# Patient Record
Sex: Female | Born: 1980 | Race: White | Hispanic: Yes | Marital: Single | Smoking: Current every day smoker
Health system: Southern US, Community
[De-identification: ages and names within clinical notes are randomized; demographics above are authoritative.]

## PROBLEM LIST (undated history)

## (undated) DIAGNOSIS — N870 Mild cervical dysplasia: Secondary | ICD-10-CM

## (undated) DIAGNOSIS — R87612 Low grade squamous intraepithelial lesion on cytologic smear of cervix (LGSIL): Secondary | ICD-10-CM

## (undated) DIAGNOSIS — F32A Depression, unspecified: Secondary | ICD-10-CM

## (undated) DIAGNOSIS — F419 Anxiety disorder, unspecified: Secondary | ICD-10-CM

## (undated) HISTORY — DX: Mild cervical dysplasia: N87.0

## (undated) HISTORY — DX: Low grade squamous intraepithelial lesion on cytologic smear of cervix (LGSIL): R87.612

## (undated) HISTORY — DX: Depression, unspecified: F32.A

## (undated) HISTORY — PX: WISDOM TOOTH EXTRACTION: SHX21

## (undated) HISTORY — DX: Anxiety disorder, unspecified: F41.9

---

## 2008-11-23 ENCOUNTER — Inpatient Hospital Stay: Payer: Self-pay | Admitting: Obstetrics & Gynecology

## 2010-02-09 ENCOUNTER — Emergency Department: Payer: Self-pay | Admitting: Family Medicine

## 2010-02-11 ENCOUNTER — Emergency Department: Payer: Self-pay | Admitting: Emergency Medicine

## 2010-02-12 ENCOUNTER — Ambulatory Visit: Payer: Self-pay | Admitting: Obstetrics and Gynecology

## 2010-08-09 HISTORY — PX: DILATION AND CURETTAGE OF UTERUS: SHX78

## 2010-12-17 ENCOUNTER — Emergency Department: Payer: Self-pay | Admitting: Emergency Medicine

## 2011-04-06 IMAGING — US US OB < 14 WEEKS - US OB TV
1 series · 17 of 28 positions shown · non-contrast
Comparison: none

REASON FOR EXAM: pregnancy, vaginal bleeding
COMMENTS:

[Series 1: us ob < 14 weeks - us ob tv · 17 of 144 slices shown]
[im 1/144]
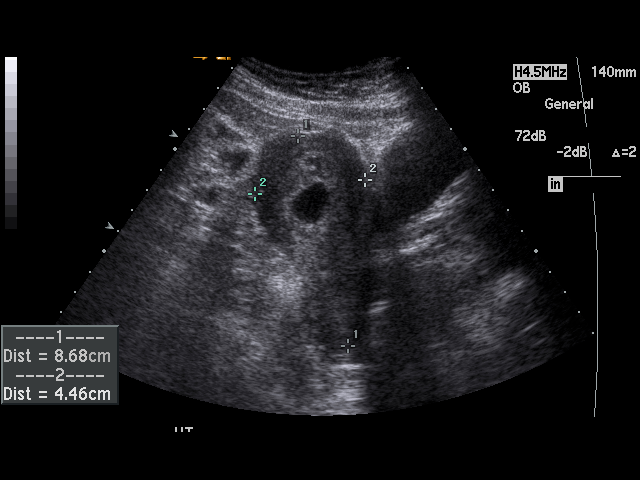
[im 11/144]
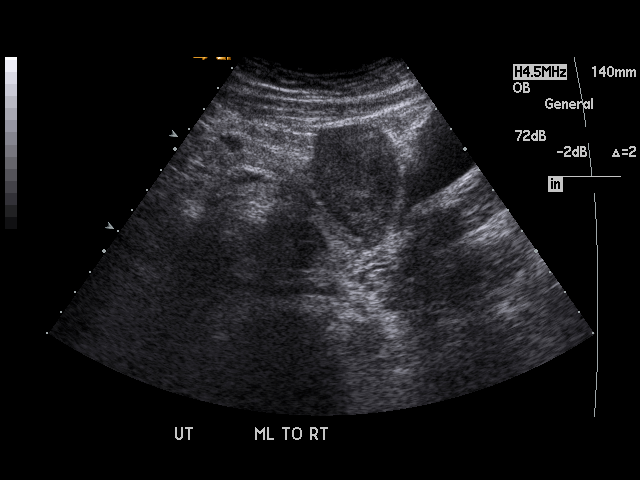
[im 22/144]
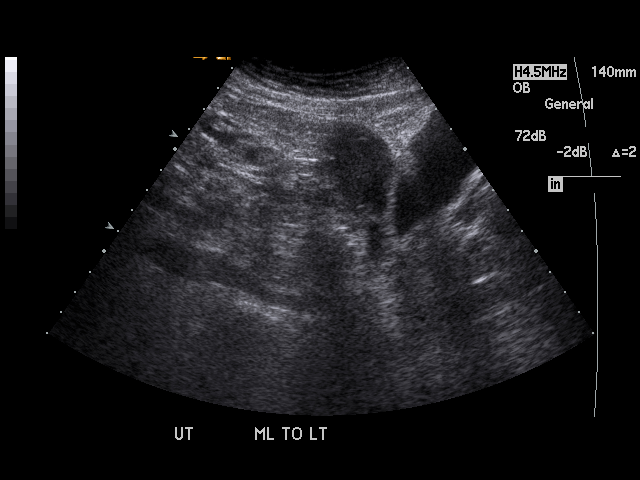
[im 27/144]
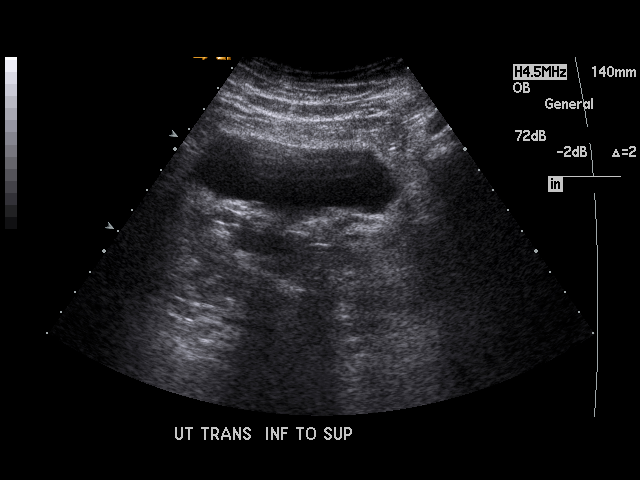
[im 38/144]
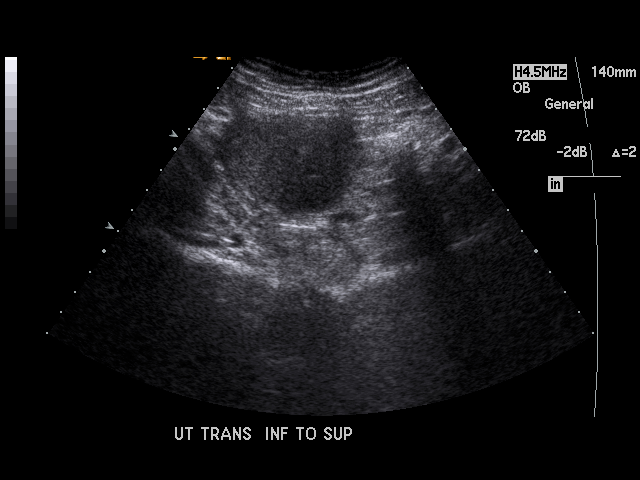
[im 48/144]
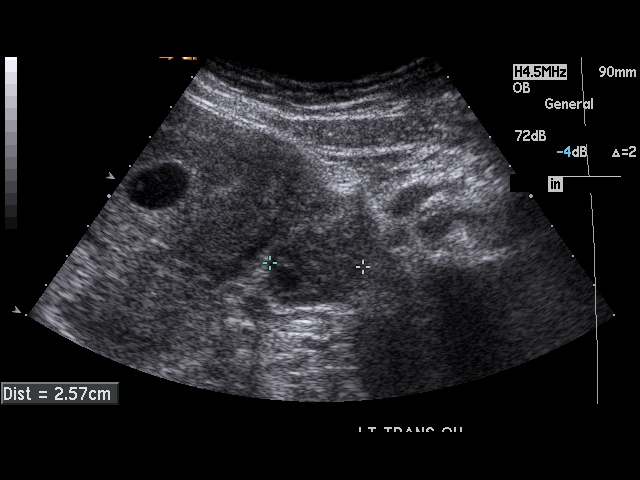
[im 53/144]
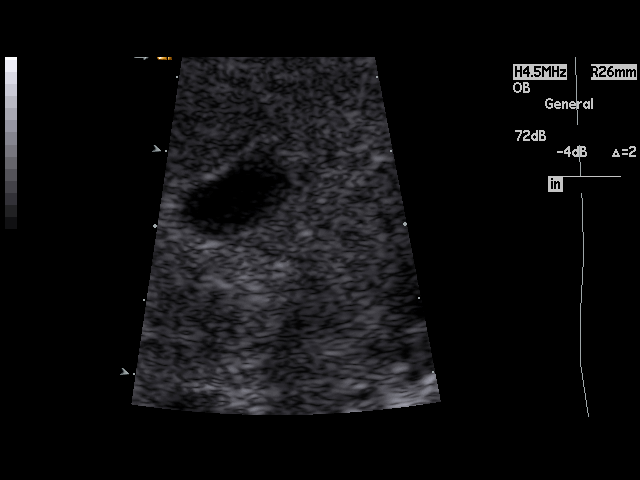
[im 64/144]
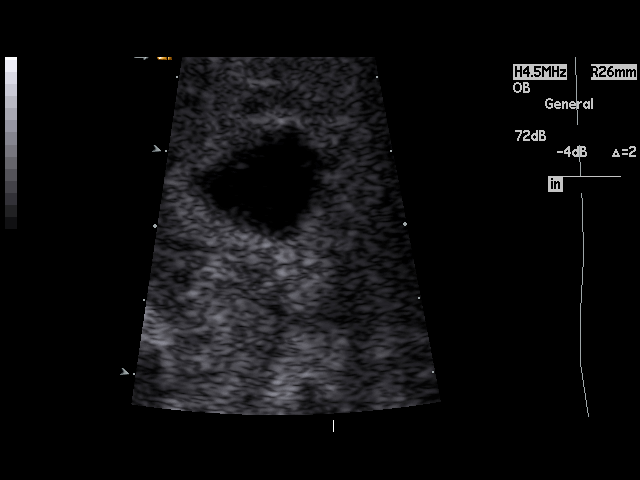
[im 75/144]
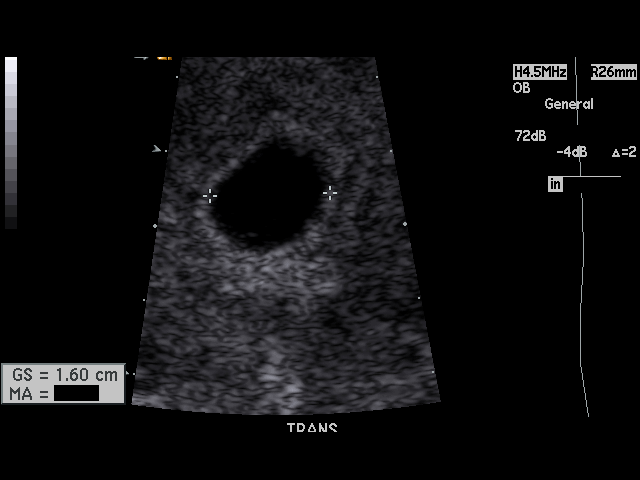
[im 80/144]
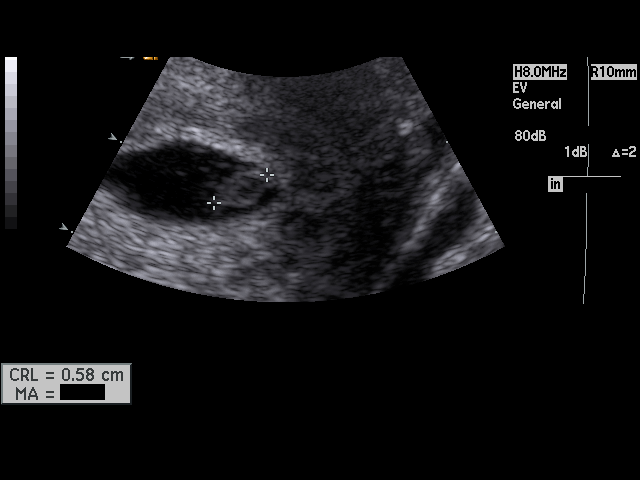
[im 91/144]
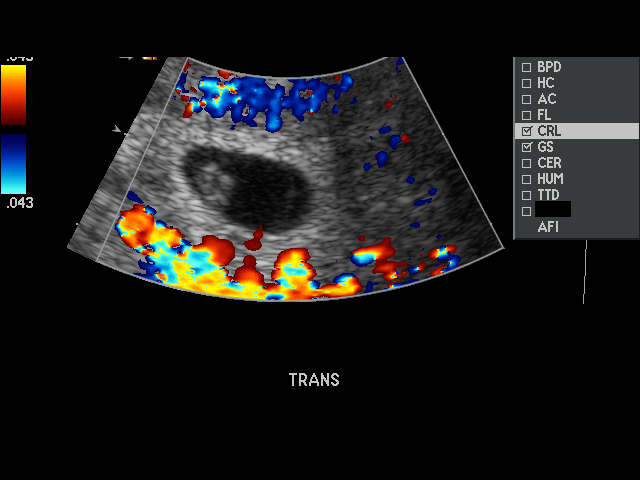
[im 96/144]
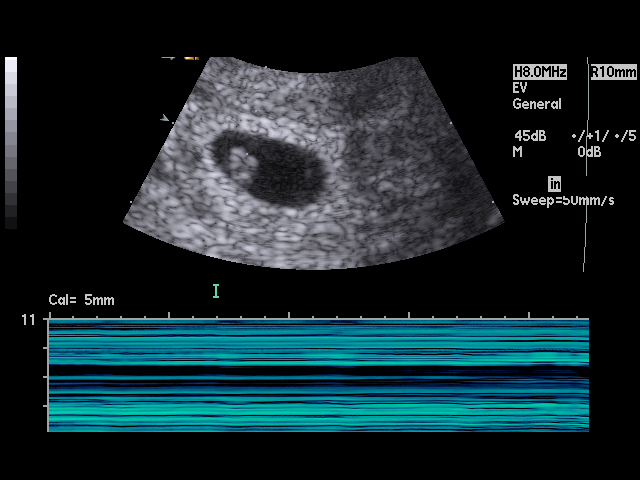
[im 106/144]
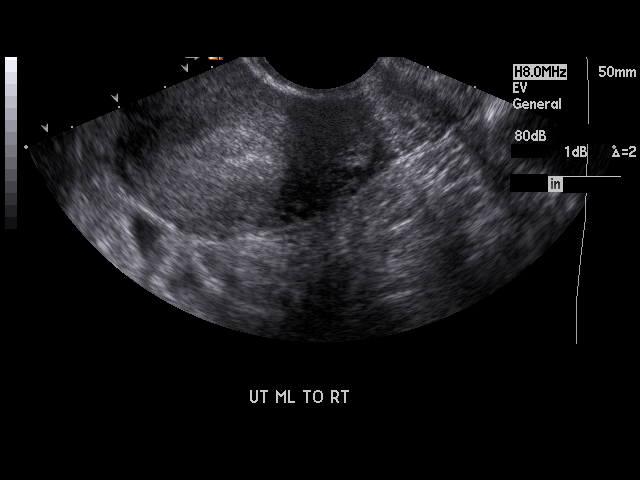
[im 117/144]
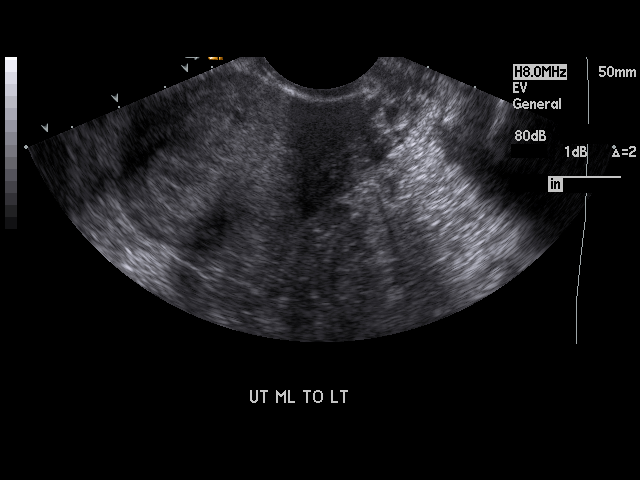
[im 122/144]
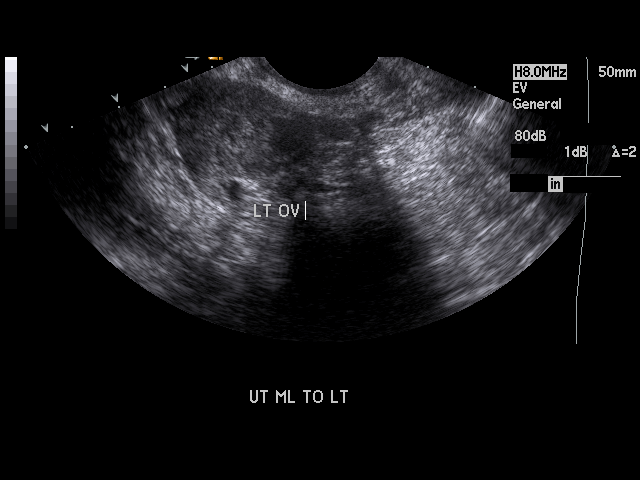
[im 133/144]
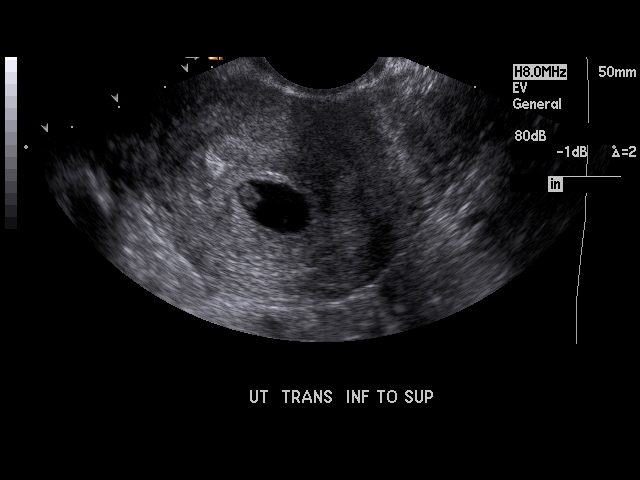
[im 144/144]
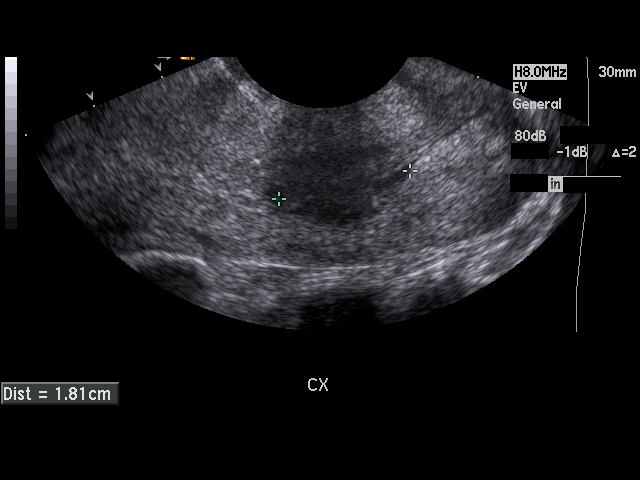

[17 of 28 positions shown; findings below may reference images not displayed]

PROCEDURE:     US  - US OB LESS THAN 14 WEEKS/W TRANS  - February 09, 2010  [DATE]

RESULT:     There is a fluid-filled sac with a fetal pole within the
endometrium consistent with an IUP. The gestational sac size is 1.76 cm
which corresponds to a 6 week 1 day gestation. The crown-rump length of the
fetus exhibits a measurement of 0.57 cm corresponding to a 6 week 3 day
gestation. Cardiac activity could not be detected as yet. The maternal
ovaries are normal in appearance.
IMPRESSION: There are findings consistent with an early IUP with
estimated gestational age of approximately 6 weeks 3 days. No fetal cardiac
activity was demonstrated and followup imaging will be needed. Of note is
the fact that the patient's menstrual dating indicates that she should be
approximately 11 weeks 3 days. Followup scanning is available upon request.

## 2012-01-07 ENCOUNTER — Observation Stay: Payer: Self-pay | Admitting: Obstetrics and Gynecology

## 2012-01-07 LAB — CBC WITH DIFFERENTIAL/PLATELET
Basophil #: 0.1 10*3/uL (ref 0.0–0.1)
HCT: 34.9 % — ABNORMAL LOW (ref 35.0–47.0)
HGB: 12.3 g/dL (ref 12.0–16.0)
Lymphocyte #: 1.7 10*3/uL (ref 1.0–3.6)
Lymphocyte %: 13.7 %
MCH: 32.9 pg (ref 26.0–34.0)
Monocyte %: 4 %
Neutrophil %: 81.3 %
RBC: 3.73 10*6/uL — ABNORMAL LOW (ref 3.80–5.20)

## 2012-01-27 ENCOUNTER — Inpatient Hospital Stay: Payer: Self-pay

## 2012-01-27 LAB — CBC WITH DIFFERENTIAL/PLATELET
Basophil %: 0.4 %
Eosinophil #: 0 10*3/uL (ref 0.0–0.7)
HCT: 36.8 % (ref 35.0–47.0)
HGB: 12.9 g/dL (ref 12.0–16.0)
Lymphocyte %: 17.7 %
MCH: 31.9 pg (ref 26.0–34.0)
MCV: 91 fL (ref 80–100)
Monocyte #: 0.6 x10 3/mm (ref 0.2–0.9)
Monocyte %: 5.6 %
Neutrophil %: 75.9 %
RDW: 13.2 % (ref 11.5–14.5)
WBC: 10.4 10*3/uL (ref 3.6–11.0)

## 2012-02-11 IMAGING — CT CT MAXILLOFACIAL WITHOUT CONTRAST
1 series · 16 of 30 positions shown, 20 images · non-contrast
Comparison: none

REASON FOR EXAM: left orbital trauma
COMMENTS:

[Series 2: facial 3.0 h60f · axial · 0.31mm/px · z∈[-252,-117]mm · 16 of 49 slices shown, 20 images]
[im 2/49  brain]
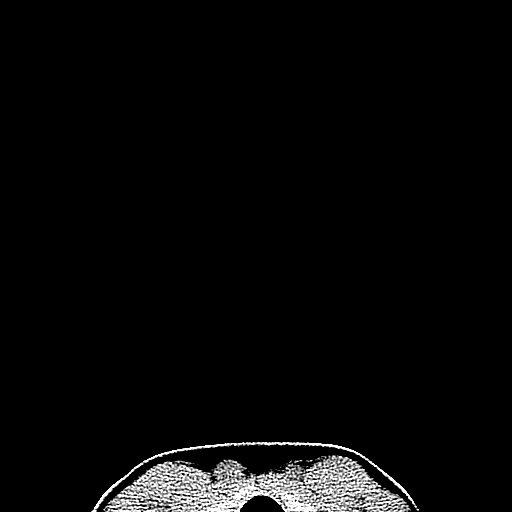
[im 2/49  bone]
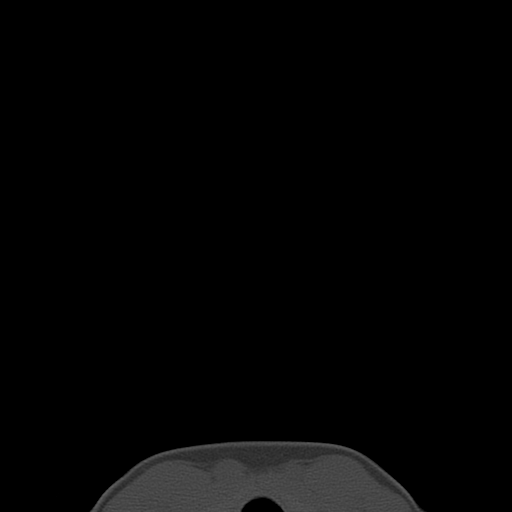
[im 5/49  bone]
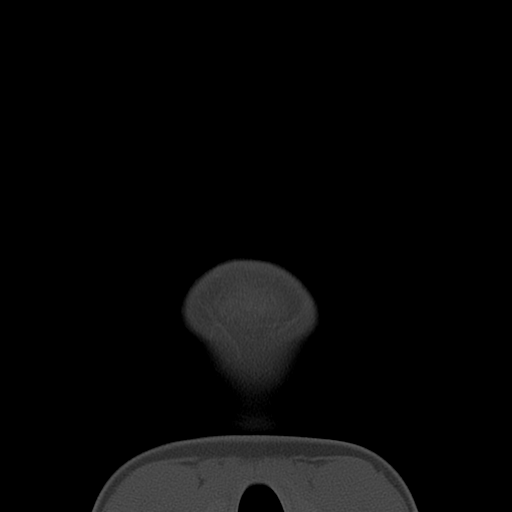
[im 9/49  bone]
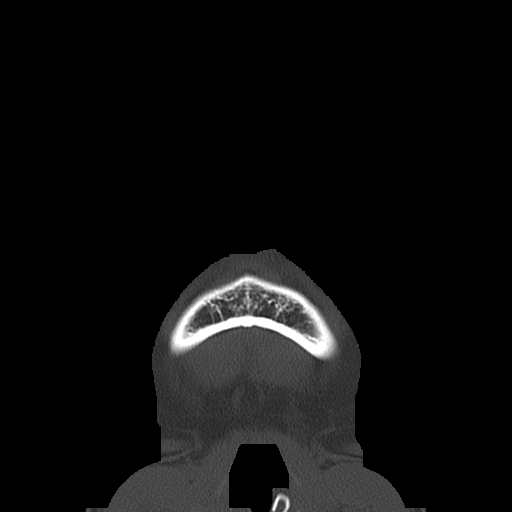
[im 12/49  bone]
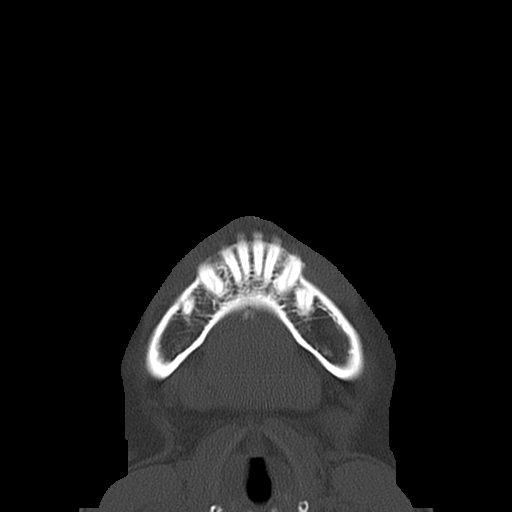
[im 14/49  brain]
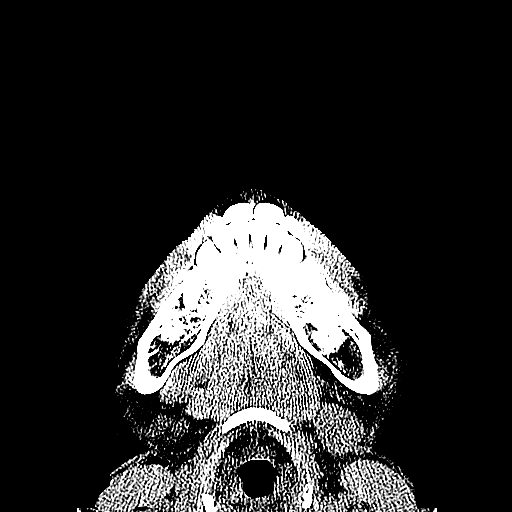
[im 14/49  bone]
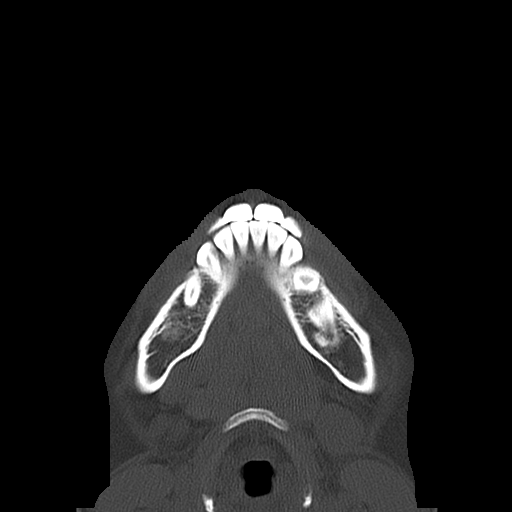
[im 17/49  bone]
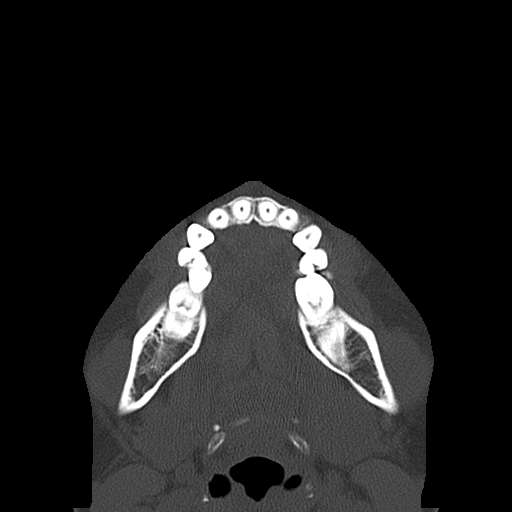
[im 20/49  bone]
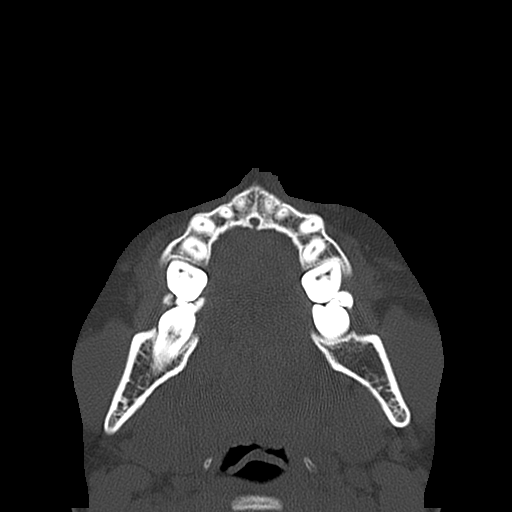
[im 24/49  bone]
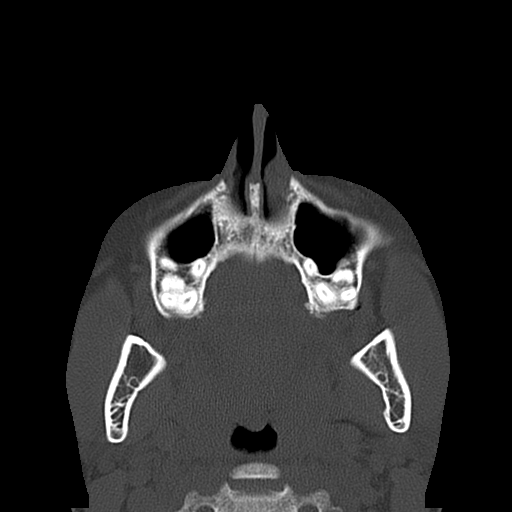
[im 25/49  brain]
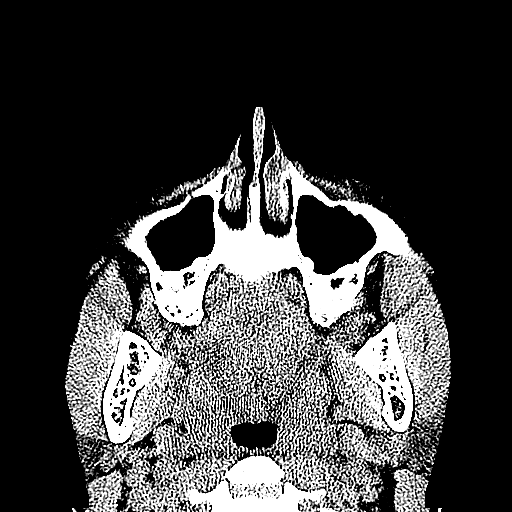
[im 25/49  bone]
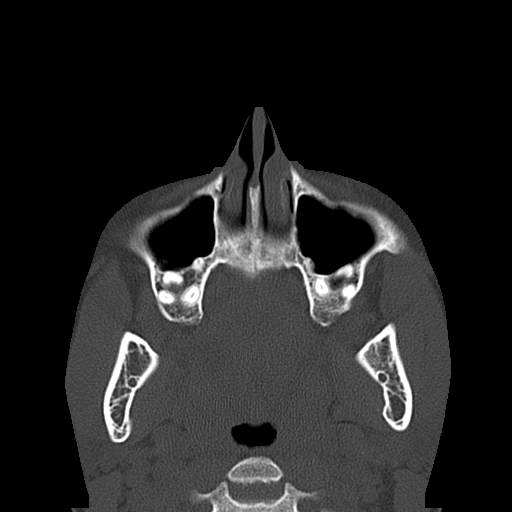
[im 29/49  bone]
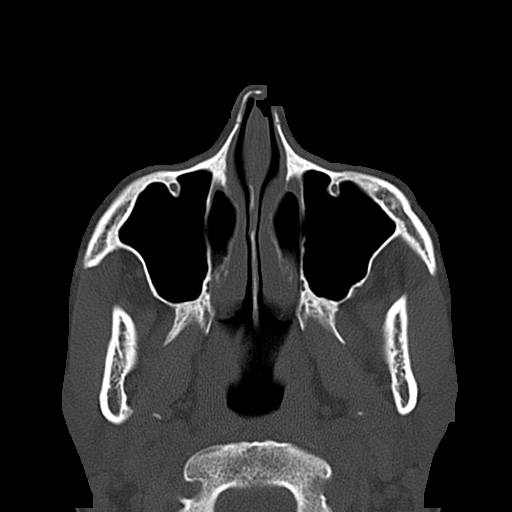
[im 32/49  bone]
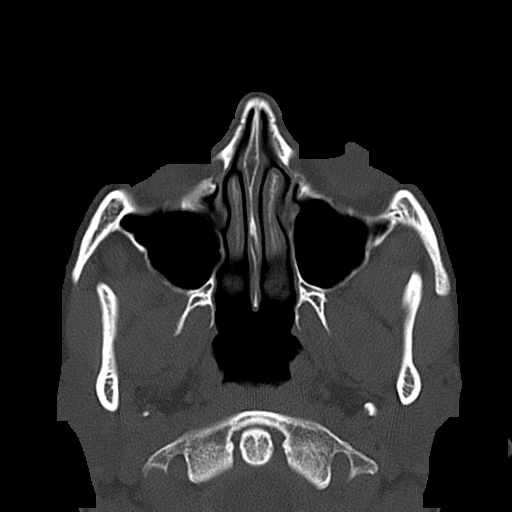
[im 35/49  bone]
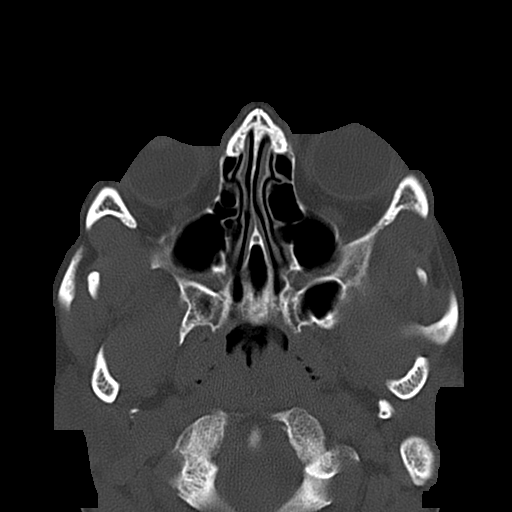
[im 37/49  brain]
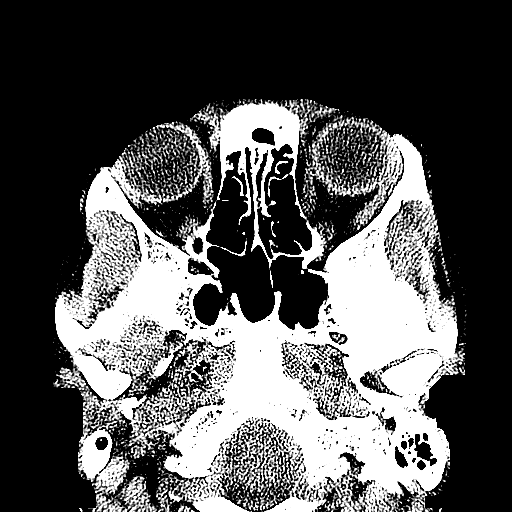
[im 37/49  bone]
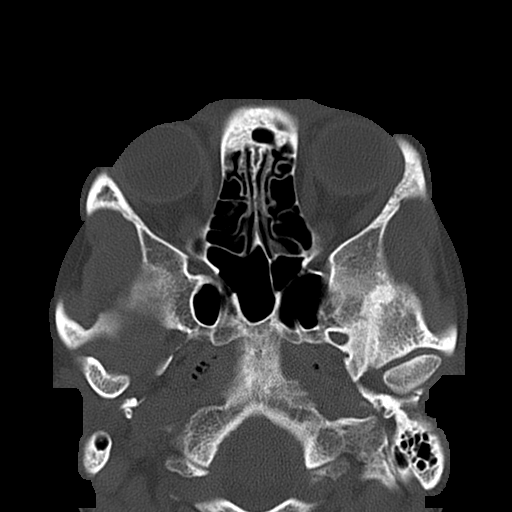
[im 40/49  bone]
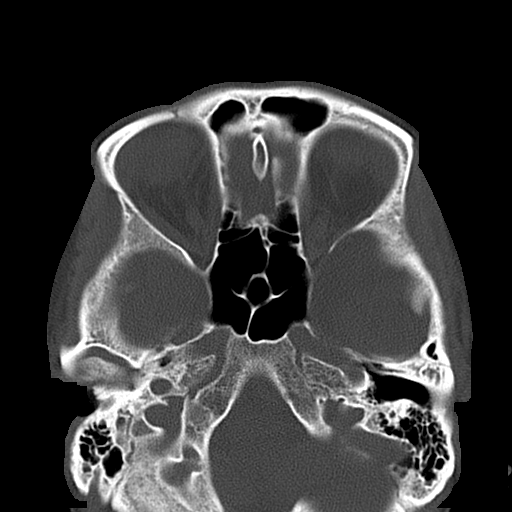
[im 44/49  bone]
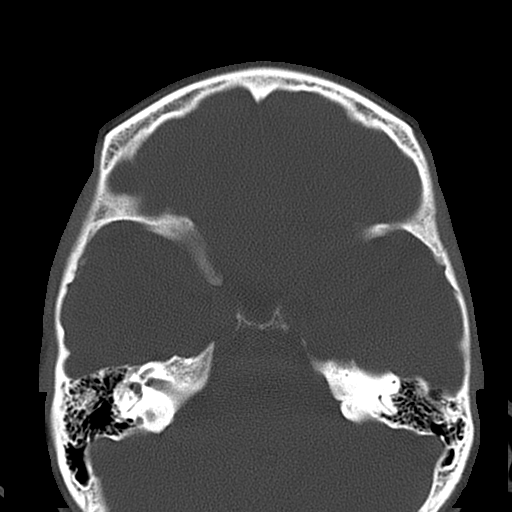
[im 47/49  bone]
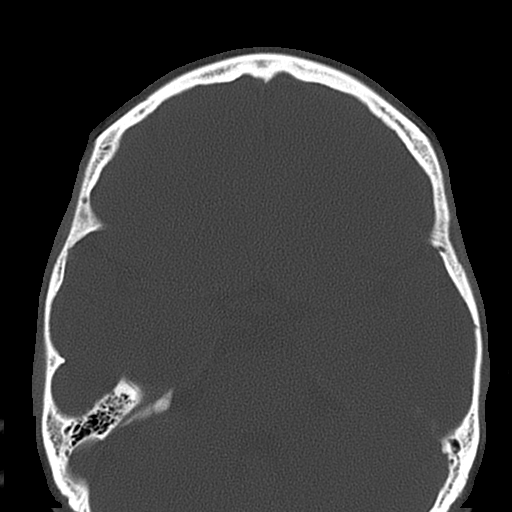

[16 of 30 positions shown; findings below may reference images not displayed]

PROCEDURE:     CT  - CT MAXILLOFACIAL AREA WO  - December 17, 2010 [DATE]

RESULT:     Axial CT scanning was performed through the facial bones at 3 mm
intervals and slice thicknesses using a bone algorithm. Coronal
reconstructions were obtained.

The bony orbits are intact. The zygomatic arches are intact. I do not see
more than minimal preseptal edema. I see no postseptal edema. The globes
appear normal. There is no free fluid in the maxillary sinuses. There is no
evidence of an orbital floor blowout fracture. The nasal passages are
patent. The nasal septum is midline. I do not see evidence of an acute nasal
bone fracture.
IMPRESSION: 1. I do not see evidence of an acute fracture of the orbital bones.
2. No other facial bone fractures are identified.
3. I see no abnormal fluid within the paranasal sinuses or within the nasal
passages.
4. There is a small amount of preseptal edema on the left. The globe appears
intact. No post septal edema is seen.

## 2014-12-01 NOTE — Op Note (Signed)
PATIENT NAME:  Erica Parker, Erica Parker MR#:  [id number] DATE OF BIRTH:  1980-09-24  DATE OF PROCEDURE:  01/27/2012  PREOPERATIVE DIAGNOSES:  1. Intrauterine pregnancy at term.  2. Secondary arrest of descent.   POSTOPERATIVE DIAGNOSES:  1. Intrauterine pregnancy at term.  2. Secondary arrest of descent.   PROCEDURE: Primary low transverse cesarean section via Pfannenstiel incision.   ANESTHESIA: Spinal.   SURGEON: Will Bonnet, MD   ASSISTANT SURGEON: Malachy Mood, MD    ESTIMATED BLOOD LOSS: 1000 mL.  OPERATIVE FLUIDS: 1600 mL.   COMPLICATIONS: None.   FINDINGS:  1. Viable female infant with Apgars of 9 at one minute and 9 at five minutes with a weight of 3210 grams.  2. Normal appearing gravid uterus, bilateral fallopian tubes, and ovaries.   SPECIMENS: None.   CONDITION: Stable at the end of the procedure.   INDICATION FOR PROCEDURE: Ms. Whipkey is a 34 year old gravida 3 para 1-0-1-1 at 75 weeks, 1 day who presented to Labor and Delivery in labor. She progressed well through labor and reached second stage, however, with pushing for greater than one hour, actually a total of two hours, she had no change in the fetal station. Given maternal effort without change in fetal station, she was diagnosed with arrest of descent and, therefore, taken to the operating room for abdominal delivery.   PROCEDURE IN DETAIL: The patient was taken to the operating room where spinal anesthesia was administered and found to be adequate. She was placed in the dorsal supine position with a leftward tilt. She was prepped and draped in the usual sterile fashion. A Pfannenstiel incision was made and carried through the various layers until the peritoneum was identified and entered sharply. The peritoneal incision was extended both cranially and caudally. A bladder flap was created and the bladder retractor was placed to pull the bladder out of the operative area of interest. A low transverse  hysterotomy was made with the scalpel and extended laterally with both cranial and caudal tension. The fetal vertex was grasped and elevated to the hysterotomy and with fundal pressure the fetal head followed by shoulders and the rest of the body were delivered without difficulty. The cord was clamped and cut and the infant was handed to the awaiting pediatrician. Cord blood was collected. The placenta was delivered spontaneously intact with a three vessel cord. The uterus was then exteriorized and cleared of all clots and debris. The hysterotomy was closed using #0 Vicryl in a running locked fashion. A second layer of the same suture was used to gain hemostasis. At this point it was noted there was pulsation of blood at the right most edge of the hysterotomy with concern for a uterine artery transection; therefore, an O'Leary stitch was thrown on the right side and hemostasis was obtained. Posterior to the uterus the blood and clots were removed and the uterus was returned to the abdomen. The gutters were cleared of all clots and debris. Peritoneum was closed using a #0 Vicryl in a running fashion.   The On-Q pump system was then placed. The catheters were placed approximately 4 cm cephalad to the incision line and approximately 2 cm apart straddling the midline. The catheters were introduced to the level just superficial to the rectus muscles and just deep to the rectus fascia. They were inserted to the level of the third mark on the catheters.   The fascia was closed using #0 PDS. The skin was closed using a 3-0 Vicryl on a  Keith needle in a subcuticular fashion. The skin was reapproximated with Dermabond. The On-Q catheters were affixed to the skin using Dermabond followed by Steri-Strips as well as Tegaderm. Each of the catheters was bolused with 0.5% Marcaine plain for a total of 10 mL through both catheters.   The patient tolerated the procedure well. Sponge, lap, and needle counts were correct x2. The  patient received prophylactic antibiotics in the form of Ancef 2 grams prior to skin incision. The patient for VTE prophylaxis was wearing pneumatic compression stockings which were in place and active throughout the procedure.   ____________________________ Will Bonnet, MD sdj:drc D: 01/27/2012 14:32:22 ET T: 01/27/2012 14:50:13 ET JOB#: [id number]  cc: Will Bonnet, MD, <Dictator> Will Bonnet MD ELECTRONICALLY SIGNED 01/28/2012 3:27

## 2014-12-17 NOTE — H&P (Signed)
L&D Evaluation:  History Expanded:   HPI 34 yo G3P1011 at 40w 1d by [facility] of 01/26/2012 per LMP and 24 week [place]. PNC at [facility] notable for late entry to care at 24 weeks, thrombocytopenia (plts 97 initially, 134 at 28 weeks), anxiety/depression tx with Zoloft (pt is unsure of dose), tobacco use. Pt had successful external cephalic version on 2/80/03    Blood Type O positive    Group B Strep Results (Result >5wks must be treated as unknown) negative    Maternal HIV Negative    Maternal Syphilis Ab Nonreactive    Maternal Varicella Non-Immune    Rubella Results immune    Patient's Medical History anxiety, depression, gestational thromboytopenia    Patient's Surgical History D&C  wisdom teeth    Medications Pre Natal Vitamins  Zoloft    Allergies Sulfa    Social History tobacco  1/2 PPD    Family History Non-Contributory   ROS:   ROS All systems were reviewed.  HEENT, CNS, GI, GU, Respiratory, CV, Renal and Musculoskeletal systems were found to be normal.   Exam:   Vital Signs stable    General no apparent distress    Mental Status clear    Chest clear    Heart no murmur/gallop/rubs    Abdomen gravid, non-tender    Estimated Fetal Weight Average for gestational age    Edema no edema    Pelvic 5 cm per RN    Mebranes Intact    FHT normal rate with no decels    Ucx regular   Impression:   Impression active labor   Plan:   Plan EFM/NST, monitor contractions and for cervical change    Comments Epidural as desired   Electronic Signatures: Ander Purpura (CNM)  (Signed 20-Jun-13 04:36)  Authored: L&D Evaluation   Last Updated: 20-Jun-13 04:36 by Ander Purpura (CNM)

## 2014-12-17 NOTE — H&P (Signed)
L&D Evaluation:  History:   HPI 34 yo G3P1011 at [redacted]w[redacted]d by [facility] of 01/26/2012 presents for external cephalic version    Patient's Medical History anxiety, depression, gestational thromboytopenia    Patient's Surgical History D&C    Medications Pre [place] Vitamins    Allergies Sulfa    Social History none    Family History Non-Contributory   ROS:   ROS All systems were reviewed.  HEENT, CNS, GI, GU, Respiratory, CV, Renal and Musculoskeletal systems were found to be normal.   Exam:   Vital Signs stable    General no apparent distress    Mental Status clear    Heart normal sinus rhythm    Abdomen gravid, non-tender    Estimated Fetal Weight Average for gestational age    Edema no edema    FHT normal rate with no decels    Ucx absent    Other AFI 14.9cm, breech   Plan:   Plan [redacted]w[redacted]d breech with adequate fluid, reassuring fetal surveillance presents for external cephalic version    Comments - Terbutaline 0.25mg  SQ once prior to procedure - External cephalic version attempted and successfull - patient observed for 1-hr post procedure with reactive fetal tracing and no contractions - Follow up [facility]   Electronic Signatures: Dorthula Nettles (MD)  (Signed 31-May-13 18:25)  Authored: L&D Evaluation   Last Updated: 31-May-13 18:25 by Dorthula Nettles (MD)

## 2016-05-09 DIAGNOSIS — R87612 Low grade squamous intraepithelial lesion on cytologic smear of cervix (LGSIL): Secondary | ICD-10-CM

## 2016-05-09 HISTORY — DX: Low grade squamous intraepithelial lesion on cytologic smear of cervix (LGSIL): R87.612

## 2016-07-09 DIAGNOSIS — N870 Mild cervical dysplasia: Secondary | ICD-10-CM

## 2016-07-09 HISTORY — DX: Mild cervical dysplasia: N87.0

## 2016-07-09 HISTORY — PX: COLPOSCOPY: SHX161

## 2017-04-14 ENCOUNTER — Ambulatory Visit: Payer: Self-pay | Admitting: Obstetrics and Gynecology

## 2017-04-25 ENCOUNTER — Ambulatory Visit: Payer: Self-pay | Admitting: Obstetrics and Gynecology

## 2017-07-08 ENCOUNTER — Telehealth: Payer: Self-pay

## 2017-07-08 NOTE — Telephone Encounter (Signed)
Pt states her period ended last week. She's been spotting this week & today started bleeding more. Inquiring when she needs to be concerned/go to ER. She has a h/o bleeding a lot. Cb# [id number]

## 2017-07-09 ENCOUNTER — Emergency Department
Admission: EM | Admit: 2017-07-09 | Discharge: 2017-07-09 | Disposition: A | Discharge status: Home / Self Care | Payer: Medicare Other | Attending: Emergency Medicine | Admitting: Emergency Medicine

## 2017-07-09 ENCOUNTER — Other Ambulatory Visit: Payer: Self-pay

## 2017-07-09 ENCOUNTER — Encounter: Payer: Self-pay | Admitting: Emergency Medicine

## 2017-07-09 DIAGNOSIS — F17210 Nicotine dependence, cigarettes, uncomplicated: Secondary | ICD-10-CM | POA: Diagnosis not present

## 2017-07-09 DIAGNOSIS — N938 Other specified abnormal uterine and vaginal bleeding: Secondary | ICD-10-CM | POA: Insufficient documentation

## 2017-07-09 LAB — POCT PREGNANCY, URINE: PREG TEST UR: NEGATIVE

## 2017-07-09 MED ORDER — TRANEXAMIC ACID 650 MG PO TABS
1300.0000 mg | ORAL_TABLET | Freq: Three times a day (TID) | ORAL | 0 refills | Status: DC
Start: 2017-07-09 — End: 2017-07-12

## 2017-07-09 NOTE — ED Triage Notes (Signed)
Pt states that she has been having bilateral lower abdominal and vaginal bleeding since Tuesday. Pt states that the bleeding started off as spotting but then got heavier on Thursday. Pt states that she is not passing any blood clots. Pt states that is not currently pregnant. Pt states that her cycles are regular. LMP 06/25/17.

## 2017-07-09 NOTE — Discharge Instructions (Signed)
Please take all of your medication as prescribed and follow-up with your [facility] gynecologist on December 5 as already scheduled.  Return to the emergency department sooner for any concerns whatsoever.  It was a pleasure to take care of you today, and thank you for coming to our emergency department.  If you have any questions or concerns before leaving please ask the nurse to grab me and I'm more than happy to go through your aftercare instructions again.  If you were prescribed any opioid pain medication today such as Norco, Vicodin, Percocet, morphine, hydrocodone, or oxycodone please make sure you do not drive when you are taking this medication as it can alter your ability to drive safely.  If you have any concerns once you are home that you are not improving or are in fact getting worse before you can make it to your follow-up appointment, please do not hesitate to call 911 and come back for further evaluation.  Darel Hong, MD  Results for orders placed or performed during the hospital encounter of 07/09/17  Pregnancy, urine POC  Result Value Ref Range   Preg Test, Ur NEGATIVE NEGATIVE

## 2017-07-09 NOTE — ED Provider Notes (Signed)
[facility] Emergency Department Provider Note  ____________________________________________   First MD Initiated Contact with Patient 07/09/17 1146     (approximate)  I have reviewed the triage vital signs and the nursing notes.   HISTORY  Chief Complaint Vaginal Bleeding and Abdominal Pain   HPI Erica Parker is a 36 y.o. female who self presents to the emergency department with 8 days of vaginal bleeding.  Her last menstrual period began around 8 days ago.  She is normally regular and her periods last about 5 days.  8 days ago she did have sex with a new partner and said she felt that she may have an injured during the intercourse.  She came to the emergency department today because her menses have been persistent and actually has been worsening somewhat.  She has no vaginal pain but she does have mild to moderate nonradiating intermittent cramping lower abdominal discomfort.  Nothing seems to make it better or worse.  History reviewed. No pertinent past medical history.  There are no active problems to display for this patient.   History reviewed. No pertinent surgical history.  Prior to Admission medications   Medication Sig Start Date End Date Taking? Authorizing Provider  tranexamic acid (LYSTEDA) 650 MG TABS tablet Take 2 tablets (1,300 mg total) by mouth 3 (three) times daily for 5 days. 07/09/17 07/14/17  Darel Hong, MD    Allergies Patient has no known allergies.  No family history on file.  Social History Social History   Tobacco Use  . Smoking status: Current Every Day Smoker  . Smokeless tobacco: Never Used  Substance Use Topics  . Alcohol use: Yes  . Drug use: No    Review of Systems Constitutional: No fever/chills Eyes: No visual changes. ENT: No sore throat. Cardiovascular: Denies chest pain. Respiratory: Denies shortness of breath. Gastrointestinal: Positive for abdominal pain.  Positive for nausea, no vomiting.   No diarrhea.  No constipation. Genitourinary: Negative for dysuria. Musculoskeletal: Negative for back pain. Skin: Negative for rash. Neurological: Negative for headaches, focal weakness or numbness.   ____________________________________________   PHYSICAL EXAM:  VITAL SIGNS: ED Triage Vitals  Enc Vitals Group     BP 07/09/17 1139 127/83     Pulse Rate 07/09/17 1139 80     Resp 07/09/17 1139 20     Temp 07/09/17 1139 98.5 F (36.9 C)     Temp Source 07/09/17 1139 Oral     SpO2 07/09/17 1139 98 %     Weight 07/09/17 1139 115 lb (52.2 kg)     Height 07/09/17 1139 5' (1.524 m)     Head Circumference --      Peak Flow --      Pain Score 07/09/17 1142 1     Pain Loc --      Pain Edu? --      Excl. in [place]? --     Constitutional: Alert and oriented x4 somewhat anxious appearing nontoxic no diaphoresis speaks in full clear sentences Eyes: PERRL EOMI. Head: Atraumatic. Nose: No congestion/rhinnorhea. Mouth/Throat: No trismus Neck: No stridor.   Cardiovascular: Normal rate, regular rhythm. Grossly normal heart sounds.  Good peripheral circulation. Respiratory: Normal respiratory effort.  No retractions. Lungs CTAB and moving good air Gastrointestinal: Soft nondistended nontender no rebound or guarding no peritonitis Pelvic exam chaperoned by female tech Carney Harder: Small amount of dried blood in the vault no active bleeding no lesions noted on the cervix no vaginal lacerations Musculoskeletal: No lower  extremity edema   Neurologic:  Normal speech and language. No gross focal neurologic deficits are appreciated. Skin:  Skin is warm, dry and intact. No rash noted. Psychiatric: Mood and affect are normal. Speech and behavior are normal.    ____________________________________________   DIFFERENTIAL includes but not limited to  Cervical cancer, endometrial cancer, vaginal laceration, sexually transmitted infection, dysfunctional uterine  bleeding ____________________________________________   LABS (all labs ordered are listed, but only abnormal results are displayed)  Labs Reviewed  POC URINE PREG, ED  POCT PREGNANCY, URINE    Lab work reviewed by me shows the patient is not pregnant __________________________________________  EKG   ____________________________________________  RADIOLOGY   ____________________________________________   PROCEDURES  Procedure(s) performed: no  Procedures  Critical Care performed: no  Observation: no ____________________________________________   INITIAL IMPRESSION / ASSESSMENT AND PLAN / ED COURSE  Pertinent labs & imaging results that were available during my care of the patient were reviewed by me and considered in my medical decision making (see chart for details).  The patient arrives hemodynamically stable well-appearing with a benign abdominal exam.  She is not pregnant.  Pelvic exam confirms a small amount of dried blood in the vault with no obvious vaginal lacerations.  Patient is given reassurance and a 5-day course of tranexamic acid orally.  She already has follow-up with her OB gynecologist in 4 days for recheck.  Strict return precautions were given the patient verbalized understanding and agreement the plan.      ____________________________________________   FINAL CLINICAL IMPRESSION(S) / ED DIAGNOSES  Final diagnoses:  Dysfunctional uterine bleeding      NEW MEDICATIONS STARTED DURING THIS VISIT:  This SmartLink is deprecated. Use AVSMEDLIST instead to display the medication list for a patient.   Note:  This document was prepared using Dragon voice recognition software and may include unintentional dictation errors.     Darel Hong, MD 07/09/17 [phone]

## 2017-07-12 ENCOUNTER — Ambulatory Visit (INDEPENDENT_AMBULATORY_CARE_PROVIDER_SITE_OTHER): Payer: Medicare Other | Admitting: Obstetrics and Gynecology

## 2017-07-12 ENCOUNTER — Encounter: Payer: Self-pay | Admitting: Obstetrics and Gynecology

## 2017-07-12 VITALS — BP 110/70 | HR 102 | Ht 60.0 in | Wt 116.0 lb

## 2017-07-12 DIAGNOSIS — Z789 Other specified health status: Secondary | ICD-10-CM | POA: Diagnosis not present

## 2017-07-12 DIAGNOSIS — Z113 Encounter for screening for infections with a predominantly sexual mode of transmission: Secondary | ICD-10-CM

## 2017-07-12 DIAGNOSIS — N938 Other specified abnormal uterine and vaginal bleeding: Secondary | ICD-10-CM | POA: Diagnosis not present

## 2017-07-12 NOTE — Patient Instructions (Signed)
I value your feedback and entrusting us with your care. If you get a [facility] patient survey, I would appreciate you taking the time to let us know about your experience today. Thank you! 

## 2017-07-12 NOTE — Progress Notes (Signed)
Chief Complaint  Patient presents with  . Gynecologic Exam    abnormal bleeding in between periods    HPI:      [name] is a 36 y.o. No obstetric history on file. who LMP was Patient's last menstrual period was 06/28/2017., presents today for 1 episode DUB this past cycle. Pt normally has monthly cycles that last 5 days. Never had BTB before. Pt had regular cycle 06/28/17, stopped bleeding for about 4 days, and then started having cramping and dark brown to red, light spotting/bleeding for about 5 days. Pt was sex active a few days before DUB sx started. She used a condom that came off. Never took Plan B. Went to ED 07/09/17 due to bleeding and had neg UPT/exam. Pt was given lysteda but never took it because bleeding sx started to taper off. Bleeding is almost gone today.  No vag, urin sx. No fevers, pelvic pain other than cramping.   Past Medical History:  Diagnosis Date  . 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 COLPOSCOPY  07/2016   cin 1  . DILATION AND CURETTAGE OF UTERUS  2012  . WISDOM TOOTH EXTRACTION     age 32    Family History  Problem Relation Age of Onset  . Cancer Mother        lung  . Heart failure Mother   . Cancer Father        colon  . Breast cancer Paternal Aunt 48  . Cancer Paternal Grandmother        stomach    Social History   Socioeconomic History  . Marital status: Single    Spouse name: Not on file  . Number of children: Not on file  . Years of education: Not on file  . Highest education level: Not on file  Social Needs  . Financial resource strain: Not on file  . Food insecurity - worry: Not on file  . Food insecurity - inability: Not on file  . Transportation needs - medical: Not on file  . Transportation needs - non-medical: Not on file  Occupational History  . Not on file  Tobacco Use  . Smoking status:  Current Every Day Smoker  . Smokeless tobacco: Never Used  Substance and Sexual Activity  . Alcohol use: Yes  . Drug use: No  . Sexual activity: Yes    Birth control/protection: Condom  Other Topics Concern  . Not on file  Social History Narrative  . Not on file    No current outpatient medications on file.   ROS:  Review of Systems  Constitutional: Negative for fever.  Gastrointestinal: Negative for blood in stool, constipation, diarrhea, nausea and vomiting.  Genitourinary: Positive for menstrual problem. Negative for dyspareunia, dysuria, flank pain, frequency, hematuria, urgency, vaginal bleeding, vaginal discharge and vaginal pain.  Musculoskeletal: Negative for back pain.  Skin: Negative for rash.     OBJECTIVE:   Vitals:  BP 110/70   Pulse (!) 102   Ht 5' (1.524 m)   Wt 116 lb (52.6 kg)   LMP 06/28/2017   BMI 22.65 kg/m   Physical Exam  Constitutional: She is oriented to person, place, and time and well-developed, well-nourished, and in no distress. Vital signs are normal.  Genitourinary: Vagina normal, uterus normal, cervix normal, right adnexa normal, left adnexa  normal and vulva normal. Uterus is not enlarged. Cervix exhibits no motion tenderness and no tenderness. Right adnexum displays no mass and no tenderness. Left adnexum displays no mass and no tenderness. Vulva exhibits no erythema, no exudate, no lesion, no rash and no tenderness. Vagina exhibits no lesion.  Genitourinary Comments: SCANT AMOUNT OF BLOOD COMING FROM OS  Neurological: She is oriented to person, place, and time.  Vitals reviewed.    Assessment/Plan: DUB (dysfunctional uterine bleeding) - 1 episode this month .Neg UPT. Check nuswab. Will f/u with results/sx. If no menses this mo, take UPT. If menses WNL, follow cycles and sx. Will eval if persist - Plan: Chlamydia/Gonococcus/Trichomonas, NAA  Problem with condom - Came off. No Plan B used. Take UPT if no or light menses this  month  Screening for STD (sexually transmitted disease) - Plan: Chlamydia/Gonococcus/Trichomonas, NAA    Return if symptoms worsen or fail to improve.  [employer] B. Marnae Madani, PA-C 07/12/2017 10:26 AM

## 2017-07-15 LAB — CHLAMYDIA/GONOCOCCUS/TRICHOMONAS, NAA
Chlamydia by NAA: NEGATIVE
Gonococcus by NAA: NEGATIVE
TRICH VAG BY NAA: NEGATIVE

## 2017-07-19 ENCOUNTER — Ambulatory Visit: Payer: Self-pay | Admitting: Obstetrics and Gynecology

## 2017-09-05 ENCOUNTER — Encounter: Payer: Self-pay | Admitting: Obstetrics and Gynecology

## 2017-09-05 ENCOUNTER — Ambulatory Visit (INDEPENDENT_AMBULATORY_CARE_PROVIDER_SITE_OTHER): Payer: Medicare Other | Admitting: Obstetrics and Gynecology

## 2017-09-05 VITALS — BP 104/66 | HR 95 | Ht 60.0 in | Wt 116.0 lb

## 2017-09-05 DIAGNOSIS — Z113 Encounter for screening for infections with a predominantly sexual mode of transmission: Secondary | ICD-10-CM | POA: Diagnosis not present

## 2017-09-05 DIAGNOSIS — N870 Mild cervical dysplasia: Secondary | ICD-10-CM

## 2017-09-05 DIAGNOSIS — R87610 Atypical squamous cells of undetermined significance on cytologic smear of cervix (ASC-US): Secondary | ICD-10-CM | POA: Diagnosis not present

## 2017-09-05 NOTE — Progress Notes (Signed)
Obstetrics & Gynecology Office Visit   Chief Complaint:  Chief Complaint  Patient presents with  . Colposcopy    History of Present Illness:Erica Parker is a 37 y.o. woman who presents today for continued surveillance for history of dysplasia. Last pap obtained on 10/2017revealed LSIL HPV positive.  Prior colposcopy CIN II and Negative ECC    Pap/Treatment History: CIN I 08/04/2016 for LGSIL HPV positive pap on 05/27/2016  Review of Systems: Review of Systems  Gastrointestinal: Negative for abdominal pain.  Genitourinary: Negative for dysuria, frequency and urgency.  Skin: Negative for itching and rash.  Neurological: Negative for dizziness.  Endo/Heme/Allergies: Does not bruise/bleed easily.     Past Medical History:  Past Medical History:  Diagnosis Date  . 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 LGSIL on Pap smear of cervix 05/2016    Past Surgical History:  Past Surgical History:  Procedure Laterality Date  . COLPOSCOPY  07/2016   cin 1  . DILATION AND CURETTAGE OF UTERUS  2012  . WISDOM TOOTH EXTRACTION     age 1    Gynecologic History: Patient's last menstrual period was 08/15/2017 (exact date).  Obstetric History: [id number]  Family History:  Family History  Problem Relation Age of Onset  . Cancer Mother        lung  . Heart failure Mother   . Cancer Father        colon  . Breast cancer Paternal Aunt 74  . Cancer Paternal Grandmother        stomach    Social History:  Social History   Socioeconomic History  . Marital status: Single    Spouse name: Not on file  . Number of children: Not on file  . Years of education: Not on file  . Highest education level: Not on file  Social Needs  . Financial resource strain: Not on file  . Food insecurity - worry: Not on file  . Food insecurity - inability: Not on file  . Transportation needs - medical: Not on file  . Transportation needs - non-medical: Not on file    Occupational History  . Not on file  Tobacco Use  . Smoking status: Current Every Day Smoker  . Smokeless tobacco: Never Used  Substance and Sexual Activity  . Alcohol use: Yes  . Drug use: No  . Sexual activity: Yes    Birth control/protection: Condom  Other Topics Concern  . Not on file  Social History Narrative  . Not on file    Allergies:  Allergies  Allergen Reactions  . Sulfa Antibiotics Other (See Comments)    Medications: Prior to Admission medications   Not on File    Physical Exam Vitals:  Vitals:   09/05/17 0947  BP: 104/66  Pulse: 95   Patient's last menstrual period was 08/15/2017 (exact date).  General: NAD HEENT: normocephalic, anicteric Pulmonary: No increased work of breathing Genitourinary:  External: Normal external female genitalia.  Normal urethral meatus, normal  Bartholin's and Skene's glands.    Vagina: Normal vaginal mucosa, no evidence of prolapse.    Cervix: Grossly normal in appearance, no bleeding  Uterus: Non-enlarged, mobile, normal contour.  No CMT  Adnexa: ovaries non-enlarged, no adnexal masses  Rectal: deferred  Lymphatic: no evidence of inguinal lymphadenopathy Extremities: no edema, erythema, or tenderness Neurologic: Grossly intact Psychiatric: mood appropriate, affect full  Female chaperone present for pelvic portions of the physical  exam  Assessment: 37 y.o. [id number] follow up for CIN I and STI screening  Plan: Problem List Items Addressed This Visit    None    Visit Diagnoses    CIN I (cervical intraepithelial neoplasia I)    -  Primary   Relevant Orders   PapIG, HPV, rfx 16/18   Routine screening for STI (sexually transmitted infection)       Relevant Orders   HEP, RPR, HIV Panel (Completed)      - Follow up pap smear from today.   - I had a lengthly discussion with Brien Few  regarding the cause of dysplasia of the lower genital tract (including immunosuppression in the setting of HPV exposure  and tobacco exposure). I explained the potential for progression to invasive malignancy, the recurrent nature of these lesions (and the need for close continued followup). Results of today's pap will dictate need for further evaluation and follow up per ASCCP guidelines..  - STI screening requested and obtained  - She is comfortable with the plan and had her questions answered.  - A total of 15 minutes were spent in face-to-face contact with the patient during this encounter with over half of that time devoted to counseling and coordination of care.  - Return in about 1 year (around 09/05/2018) for annual.

## 2017-09-06 ENCOUNTER — Encounter: Payer: Self-pay | Admitting: Obstetrics and Gynecology

## 2017-09-06 LAB — HEP, RPR, HIV PANEL
HEP B S AG: NEGATIVE
HIV Screen 4th Generation wRfx: NONREACTIVE
RPR: NONREACTIVE

## 2017-09-07 LAB — PAPIG, HPV, RFX 16/18
HPV, high-risk: NEGATIVE
PAP SMEAR COMMENT: 0

## 2017-09-23 ENCOUNTER — Encounter: Payer: Self-pay | Admitting: Obstetrics and Gynecology

## 2017-09-26 ENCOUNTER — Telehealth: Payer: Self-pay | Admitting: Obstetrics and Gynecology

## 2017-09-26 NOTE — Telephone Encounter (Signed)
Called and left voice mail for patient to call back to be schedule °

## 2017-09-26 NOTE — Telephone Encounter (Signed)
-----   Message from Malachy Mood, MD sent at 09/23/2017  9:22 AM EST ----- Regarding: pap Needs colposcopy in the next 2-6 weeks

## 2017-09-28 NOTE — Telephone Encounter (Signed)
Called and left voice mail for patient to call back to be schedule °

## 2018-01-12 ENCOUNTER — Encounter: Payer: Self-pay | Admitting: Obstetrics and Gynecology

## 2018-01-12 ENCOUNTER — Other Ambulatory Visit: Payer: Self-pay

## 2018-01-12 ENCOUNTER — Ambulatory Visit (INDEPENDENT_AMBULATORY_CARE_PROVIDER_SITE_OTHER): Payer: Medicare Other | Admitting: Obstetrics and Gynecology

## 2018-01-12 VITALS — BP 110/74 | HR 79 | Ht 60.0 in | Wt 116.0 lb

## 2018-01-12 DIAGNOSIS — R87810 Cervical high risk human papillomavirus (HPV) DNA test positive: Secondary | ICD-10-CM | POA: Diagnosis not present

## 2018-01-12 DIAGNOSIS — N870 Mild cervical dysplasia: Secondary | ICD-10-CM | POA: Diagnosis not present

## 2018-01-12 NOTE — Progress Notes (Signed)
   GYNECOLOGY CLINIC COLPOSCOPY PROCEDURE NOTE  37 y.o. [id number] here for colposcopy for NIL and HR HPV+  pap smear on 09/05/2017, with preceding pap smear 05/27/2016 LGSIL HPV positive with colposcopy 08/04/2016 showing CIN I. Discussed underlying role for HPV infection in the development of cervical dysplasia, its natural history and progression/regression, need for surveillance.  Is the patient  pregnant: No LMP: Patient's last menstrual period was 12/29/2017. Smoking status:  reports that she has been smoking.  She has never used smokeless tobacco. Contraception: abstinence  Patient given informed consent, signed copy in the chart, time out was performed.  The patient was position in dorsal lithotomy position. Speculum was placed the cervix was visualized.   After application of acetic acid colposcopic inspection of the cervix was undertaken.   Colposcopy adequate, full visualization of transformation zone: Yes no visible lesions; random 12 O'Clock biopsy obtained.   ECC specimen obtained:  Yes  All specimens were labeled and sent to pathology.   Patient was given post procedure instructions.  Will follow up pathology and manage accordingly.  Routine preventative health maintenance measures emphasized.  OBGyn Exam      Malachy Mood, MD, [facility], [facility]

## 2018-01-16 LAB — PATHOLOGY

## 2018-01-17 ENCOUNTER — Encounter (INDEPENDENT_AMBULATORY_CARE_PROVIDER_SITE_OTHER): Payer: Self-pay

## 2018-03-01 NOTE — Telephone Encounter (Signed)
Pt was scheduled & seen for eval

## 2019-02-12 ENCOUNTER — Ambulatory Visit: Payer: Medicare Other | Admitting: Obstetrics & Gynecology

## 2019-02-22 ENCOUNTER — Ambulatory Visit: Payer: Medicare Other | Admitting: Obstetrics and Gynecology

## 2019-02-28 ENCOUNTER — Other Ambulatory Visit (HOSPITAL_COMMUNITY)
Admission: RE | Admit: 2019-02-28 | Discharge: 2019-02-28 | Disposition: A | Discharge status: Home / Self Care | Payer: Medicare Other | Source: Ambulatory Visit | Attending: Obstetrics and Gynecology | Admitting: Obstetrics and Gynecology

## 2019-02-28 ENCOUNTER — Other Ambulatory Visit: Payer: Self-pay

## 2019-02-28 ENCOUNTER — Ambulatory Visit (INDEPENDENT_AMBULATORY_CARE_PROVIDER_SITE_OTHER): Payer: Medicare Other | Admitting: Obstetrics and Gynecology

## 2019-02-28 ENCOUNTER — Encounter: Payer: Self-pay | Admitting: Obstetrics and Gynecology

## 2019-02-28 VITALS — BP 121/78 | HR 98 | Ht 60.0 in | Wt 118.0 lb

## 2019-02-28 DIAGNOSIS — Z1151 Encounter for screening for human papillomavirus (HPV): Secondary | ICD-10-CM | POA: Diagnosis not present

## 2019-02-28 DIAGNOSIS — Z3202 Encounter for pregnancy test, result negative: Secondary | ICD-10-CM

## 2019-02-28 DIAGNOSIS — N912 Amenorrhea, unspecified: Secondary | ICD-10-CM | POA: Diagnosis not present

## 2019-02-28 DIAGNOSIS — Z124 Encounter for screening for malignant neoplasm of cervix: Secondary | ICD-10-CM | POA: Insufficient documentation

## 2019-02-28 DIAGNOSIS — Z1231 Encounter for screening mammogram for malignant neoplasm of breast: Secondary | ICD-10-CM

## 2019-02-28 DIAGNOSIS — Z01419 Encounter for gynecological examination (general) (routine) without abnormal findings: Secondary | ICD-10-CM

## 2019-02-28 LAB — POCT URINE PREGNANCY: Preg Test, Ur: NEGATIVE

## 2019-02-28 NOTE — Progress Notes (Signed)
Gynecology Annual Exam   PCP: Patient, No Pcp Per  Chief Complaint:  Chief Complaint  Patient presents with  . Gynecologic Exam    Amenorrhea x few months/negative home test  . Right breast lump    History of Present Illness: Patient is a 38 y.o. [id number] presents for annual exam. The patient has no complaints today.   LMP: Patient's last menstrual period was 11/09/2018 (approximate). Reports 3 months of amenorrhea.  No change in medications.  Some temperature intolerance, no weight changes, hirsutism, acne, no headaches, no vision changes, no galactorrhea.  The patient is sexually active. She currently uses none for contraception. She denies dyspareunia.  The patient does perform self breast exams.  There is no notable family history of breast or ovarian cancer in her family.  The patient wears seatbelts: yes.   The patient has regular exercise: not asked.    The patient denies current symptoms of depression.    Review of Systems: ROS  Past Medical History:  Past Medical History:  Diagnosis Date  . 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 LGSIL on Pap smear of cervix 05/2016    Past Surgical History:  Past Surgical History:  Procedure Laterality Date  . COLPOSCOPY  07/2016   cin 1  . DILATION AND CURETTAGE OF UTERUS  2012  . WISDOM TOOTH EXTRACTION     age 59    Gynecologic History:  Patient's last menstrual period was 11/09/2018 (approximate). Contraception: none Last Pap: Results were:  01/12/2018 Colposcopy negative 09/05/2018 ASCUS HPV negative 08/04/2016 CIN I negative ECC 05/27/2016 LGSIL HPV positive pap on   Obstetric History: [id number]  Family History:  Family History  Problem Relation Age of Onset  . Cancer Mother        lung  . Heart failure Mother   . Cancer Father        colon  . Breast cancer Paternal Aunt 2  . Cancer Paternal Grandmother        stomach    Social History:  Social History   Socioeconomic History   . Marital status: Single    Spouse name: Not on file  . Number of children: Not on file  . Years of education: Not on file  . Highest education level: Not on file  Occupational History  . Not on file  Social Needs  . Financial resource strain: Not on file  . Food insecurity    Worry: Not on file    Inability: Not on file  . Transportation needs    Medical: Not on file    Non-medical: Not on file  Tobacco Use  . Smoking status: Current Every Day Smoker  . Smokeless tobacco: Never Used  Substance and Sexual Activity  . Alcohol use: Yes  . Drug use: No  . Sexual activity: Yes    Birth control/protection: Condom  Lifestyle  . Physical activity    Days per week: Not on file    Minutes per session: Not on file  . Stress: Not on file  Relationships  . Social Herbalist on phone: Not on file    Gets together: Not on file    Attends religious service: Not on file    Active member of club or organization: Not on file    Attends meetings of clubs or organizations: Not on file    Relationship status: Not on file  . Intimate partner violence  Fear of current or ex partner: Not on file    Emotionally abused: Not on file    Physically abused: Not on file    Forced sexual activity: Not on file  Other Topics Concern  . Not on file  Social History Narrative  . Not on file    Allergies:  Allergies  Allergen Reactions  . Sulfa Antibiotics Other (See Comments)    Medications: Prior to Admission medications   Not on File    Physical Exam Vitals: Blood pressure 121/78, pulse 98, height 5' (1.524 m), weight 118 lb (53.5 kg), last menstrual period 11/09/2018.  General: NAD HEENT: normocephalic, anicteric Thyroid: no enlargement, no palpable nodules Pulmonary: No increased work of breathing, CTAB Cardiovascular: RRR, distal pulses 2+ Breast: Breast symmetrical, no tenderness, no palpable nodules or masses, no skin or nipple retraction present, no nipple  discharge.  No axillary or supraclavicular lymphadenopathy. Abdomen: NABS, soft, non-tender, non-distended.  Umbilicus without lesions.  No hepatomegaly, splenomegaly or masses palpable. No evidence of hernia  Genitourinary:  External: Normal external female genitalia.  Normal urethral meatus, normal Bartholin's and Skene's glands.    Vagina: Normal vaginal mucosa, no evidence of prolapse.    Cervix: Grossly normal in appearance, no bleeding  Uterus: Non-enlarged, mobile, normal contour.  No CMT  Adnexa: ovaries non-enlarged, no adnexal masses  Rectal: deferred  Lymphatic: no evidence of inguinal lymphadenopathy Extremities: no edema, erythema, or tenderness Neurologic: Grossly intact Psychiatric: mood appropriate, affect full  Female chaperone present for pelvic and breast  portions of the physical exam    Assessment: 38 y.o. O2U2353 routine annual exam  Plan: Problem List Items Addressed This Visit    None    Visit Diagnoses    Amenorrhea    -  Primary   Relevant Orders   POCT urine pregnancy (Completed)   Overly   Estradiol   TSH   Prolactin   Encounter for gynecological examination without abnormal finding       Screening for malignant neoplasm of cervix       Relevant Orders   Cytology - PAP   Encounter for screening mammogram for breast cancer       Relevant Orders   MM 3D SCREEN BREAST BILATERAL      2) STI screening  was notoffered and therefore not obtained  2)  ASCCP guidelines and rational discussed.  Patient opts for every 3 years screening interval  3) Contraception - the patient is currently using  none.  She is happy with her current form of contraception and plans to continue  4) Routine healthcare maintenance including cholesterol, diabetes screening discussed managed by PCP  5) Secondary Amenorrhea - basic laboratory work up.  If negative discussed provera vs POP   6) Return in about 1 year (around 02/28/2020) for annual.   Malachy Mood, MD,  [place], [facility] 02/28/2019, 3:07 PM

## 2019-02-28 NOTE — Patient Instructions (Signed)
[facility] [address] [place] [address]  [facility]  [address] [place] [address]  Phone: [phone]  

## 2019-03-01 LAB — FOLLICLE STIMULATING HORMONE: FSH: 30.2 m[IU]/mL

## 2019-03-01 LAB — PROLACTIN: Prolactin: 7.8 ng/mL (ref 4.8–23.3)

## 2019-03-01 LAB — ESTRADIOL: Estradiol: 79.9 pg/mL

## 2019-03-01 LAB — TSH: TSH: 1.97 u[IU]/mL (ref 0.450–4.500)

## 2019-03-05 ENCOUNTER — Other Ambulatory Visit: Payer: Self-pay | Admitting: Obstetrics and Gynecology

## 2019-03-05 LAB — CYTOLOGY - PAP
Adequacy: ABSENT
Diagnosis: NEGATIVE
HPV: NOT DETECTED

## 2019-03-05 MED ORDER — MEDROXYPROGESTERONE ACETATE 10 MG PO TABS: 10.0000 mg | ORAL_TABLET | Freq: Every day | ORAL | 0 refills | Status: DC

## 2019-03-05 NOTE — Progress Notes (Signed)
Elevated FSH level, slight lower estradiol level for patient age.  Will do provera challenge to check estrogen priming on endometrium.  If no withdrawal bleed consider repeat FSH/estradiol and AMH

## 2019-03-06 ENCOUNTER — Encounter: Payer: Self-pay | Admitting: Obstetrics and Gynecology

## 2019-10-11 DIAGNOSIS — Z23 Encounter for immunization: Secondary | ICD-10-CM | POA: Diagnosis not present

## 2019-11-08 DIAGNOSIS — Z23 Encounter for immunization: Secondary | ICD-10-CM | POA: Diagnosis not present

## 2020-03-19 DIAGNOSIS — Z20822 Contact with and (suspected) exposure to covid-19: Secondary | ICD-10-CM | POA: Diagnosis not present

## 2020-07-25 DIAGNOSIS — Z23 Encounter for immunization: Secondary | ICD-10-CM | POA: Diagnosis not present

## 2022-04-07 DIAGNOSIS — N870 Mild cervical dysplasia: Secondary | ICD-10-CM | POA: Insufficient documentation

## 2022-04-07 NOTE — Progress Notes (Deleted)
   PCP:  Patient, No Pcp Per   No chief complaint on file.    HPI:      [name] is a 41 y.o. [id number] whose LMP was No LMP recorded., presents today for her annual examination.  Her menses are {norm/abn:715}, lasting {number: 22536} days.  Dysmenorrhea {dysmen:716}. She {does:18564} have intermenstrual bleeding.  Sex activity: {sex active: 315163}.  Last Pap: 02/28/19  Results were: no abnormalities /neg HPV DNA Hx of STDs: {STD hx:14358}   Last Pap: Results were:  01/12/2018 Colposcopy negative 09/05/2018 ASCUS HPV negative 08/04/2016 CIN I negative ECC 05/27/2016 LGSIL HPV positive pap on    Last mammogram: {date:304500300}  Results were: {norm/abn:13465} There is no FH of breast cancer. There is no FH of ovarian cancer. The patient {does:18564} do self-breast exams.  Tobacco use: {tob:20664} Alcohol use: {Alcohol:11675} No drug use.  Exercise: {exercise:31265}  She {does:18564} get adequate calcium and Vitamin D in her diet.  There are no problems to display for this patient.   Past Surgical History:  Procedure Laterality Date   COLPOSCOPY  07/2016   cin 1   DILATION AND CURETTAGE OF UTERUS  2012   WISDOM TOOTH EXTRACTION     age 28    Family History  Problem Relation Age of Onset   Cancer Mother        lung   Heart failure Mother    Cancer Father 32       colon   Breast cancer Paternal Aunt 50   Stomach cancer Paternal Grandmother     Social History   Socioeconomic History   Marital status: Single    Spouse name: Not on file   Number of children: Not on file   Years of education: Not on file   Highest education level: Not on file  Occupational History   Not on file  Tobacco Use   Smoking status: Every Day   Smokeless tobacco: Never  Vaping Use   Vaping Use: Every day  Substance and Sexual Activity   Alcohol use: Yes   Drug use: No   Sexual activity: Yes    Birth control/protection: Condom  Other Topics Concern   Not on file   Social History Narrative   Not on file   Social Determinants of Health   Financial Resource Strain: Not on file  Food Insecurity: Not on file  Transportation Needs: Not on file  Physical Activity: Not on file  Stress: Not on file  Social Connections: Not on file  Intimate Partner Violence: Not on file     Current Outpatient Medications:    medroxyPROGESTERone (PROVERA) 10 MG tablet, Take 1 tablet (10 mg total) by mouth daily., Disp: 10 tablet, Rfl: 0     ROS:  Review of Systems BREAST: No symptoms   Objective: There were no vitals taken for this visit.   OBGyn Exam  Results: No results found for this or any previous visit (from the past 24 hour(s)).  Assessment/Plan: No diagnosis found.  No orders of the defined types were placed in this encounter.            GYN counsel {counseling: 16159}     F/U  No follow-ups on file.  Henlee Donovan B. Macedonio Scallon, PA-C 04/07/2022 7:24 PM

## 2022-04-08 ENCOUNTER — Ambulatory Visit: Payer: Medicare HMO | Admitting: Obstetrics and Gynecology

## 2022-04-08 DIAGNOSIS — N870 Mild cervical dysplasia: Secondary | ICD-10-CM

## 2022-04-08 DIAGNOSIS — Z1231 Encounter for screening mammogram for malignant neoplasm of breast: Secondary | ICD-10-CM

## 2022-04-08 DIAGNOSIS — Z01419 Encounter for gynecological examination (general) (routine) without abnormal findings: Secondary | ICD-10-CM

## 2022-04-08 DIAGNOSIS — Z124 Encounter for screening for malignant neoplasm of cervix: Secondary | ICD-10-CM

## 2022-04-08 DIAGNOSIS — Z1151 Encounter for screening for human papillomavirus (HPV): Secondary | ICD-10-CM

## 2022-04-16 ENCOUNTER — Encounter: Payer: Self-pay | Admitting: Family Medicine

## 2022-04-16 ENCOUNTER — Ambulatory Visit (INDEPENDENT_AMBULATORY_CARE_PROVIDER_SITE_OTHER): Payer: Medicare HMO | Admitting: Family Medicine

## 2022-04-16 VITALS — BP 108/80 | HR 64 | Ht 60.0 in | Wt 122.0 lb

## 2022-04-16 DIAGNOSIS — F5104 Psychophysiologic insomnia: Secondary | ICD-10-CM | POA: Diagnosis not present

## 2022-04-16 DIAGNOSIS — F419 Anxiety disorder, unspecified: Secondary | ICD-10-CM

## 2022-04-16 DIAGNOSIS — Z23 Encounter for immunization: Secondary | ICD-10-CM | POA: Diagnosis not present

## 2022-04-16 DIAGNOSIS — F32A Depression, unspecified: Secondary | ICD-10-CM

## 2022-04-16 MED ORDER — SERTRALINE HCL 25 MG PO TABS: 25.0000 mg | ORAL_TABLET | Freq: Every day | ORAL | 3 refills | Status: DC

## 2022-04-16 MED ORDER — QUVIVIQ 50 MG PO TABS: 1.0000 | ORAL_TABLET | Freq: Every evening | ORAL | 0 refills | Status: DC | PRN

## 2022-04-16 NOTE — Assessment & Plan Note (Signed)
Chronic insomnia in the setting of comorbid anxiety/depression, we discussed the interplay between these 2 entities, treatment strategies, will trial Quviviq nightly as needed while working directly on anxiety/depression management.

## 2022-04-16 NOTE — Progress Notes (Signed)
     Primary Care / Sports Medicine Office Visit  Patient Information:  Patient ID: Erica Parker, female DOB: 1980-12-06 Age: 41 y.o. MRN: [id number]   Oral Hallgren Brienza is a pleasant 41 y.o. female presenting with the following:  Chief Complaint  Patient presents with   Establish Care   Flu Vaccine   Nicotine Dependence    Wants patches    Vitals:   04/16/22 1022  BP: 108/80  Pulse: 64   Vitals:   04/16/22 1022  Weight: 122 lb (55.3 kg)  Height: 5' (1.524 m)   Body mass index is 23.83 kg/m.  No results found.   Independent interpretation of notes and tests performed by another provider:   None  Procedures performed:   None  Pertinent History, Exam, Impression, and Recommendations:   Problem List Items Addressed This Visit       Other   Anxiety and depression - Primary    Patient with longstanding history of anxiety depression, has been on multiple medication in the past, list in overview.  States concern for possible ADHD as well, at this stage next patient to find psychiatry group of her choosing, we will coordinate referral.  Additionally, we discussed both medication on nonpharmacologic management options.  She is amenable to a restart of sertraline as this was the most recent agent she utilized with some benefit, will start at low-dose 25 mg daily, did advise pharmacologic treatment as an adjunct.  We will coordinate follow-up in 6 weeks with plan to titrate pending symptoms.      Relevant Medications   sertraline (ZOLOFT) 25 MG tablet   Psychophysiological insomnia    Chronic insomnia in the setting of comorbid anxiety/depression, we discussed the interplay between these 2 entities, treatment strategies, will trial Quviviq nightly as needed while working directly on anxiety/depression management.      Relevant Medications   Daridorexant HCl (QUVIVIQ) 50 MG TABS   Need for immunization against influenza    Annual influenza vaccine administered today.       Relevant Orders   Flu Vaccine QUAD 75moIM (Fluarix, Fluzone & Alfiuria Quad PF) (Completed)     Orders & Medications Meds ordered this encounter  Medications   sertraline (ZOLOFT) 25 MG tablet    Sig: Take 1 tablet (25 mg total) by mouth daily.    Dispense:  30 tablet    Refill:  3   Daridorexant HCl (QUVIVIQ) 50 MG TABS    Sig: Take 1 tablet by mouth at bedtime as needed.    Dispense:  90 tablet    Refill:  0   Orders Placed This Encounter  Procedures   Flu Vaccine QUAD 658moM (Fluarix, Fluzone & Alfiuria Quad PF)     Return in about 6 weeks (around 05/28/2022) for Annual Physical.     JaMontel CulverMD   [place]

## 2022-04-16 NOTE — Assessment & Plan Note (Signed)
Annual influenza vaccine administered today. 

## 2022-04-16 NOTE — Assessment & Plan Note (Signed)
Patient with longstanding history of anxiety depression, has been on multiple medication in the past, list in overview.  States concern for possible ADHD as well, at this stage next patient to find psychiatry group of her choosing, we will coordinate referral.  Additionally, we discussed both medication on nonpharmacologic management options.  She is amenable to a restart of sertraline as this was the most recent agent she utilized with some benefit, will start at low-dose 25 mg daily, did advise pharmacologic treatment as an adjunct.  We will coordinate follow-up in 6 weeks with plan to titrate pending symptoms.

## 2022-04-16 NOTE — Patient Instructions (Addendum)
-   Start sertraline daily - Can dose Quviviq (insomnia medication) nightly on as-needed basis - Review information provided - Return for follow-up in 6 weeks for annual physical

## 2022-05-07 ENCOUNTER — Inpatient Hospital Stay: Admission: RE | Admit: 2022-05-07 | Payer: Medicare HMO | Source: Ambulatory Visit

## 2022-05-11 NOTE — Progress Notes (Deleted)
PCP:  Montel Culver, MD   No chief complaint on file.    HPI:      [name] is a 41 y.o. [id number] whose LMP was No LMP recorded. (Menstrual status: Irregular Periods)., presents today for her annual examination.  Her menses are {norm/abn:715}, lasting {number: 22536} days.  Dysmenorrhea {dysmen:716}. She {does:18564} have intermenstrual bleeding. Hx of amenorrhea at times with neg labs.   Sex activity: {sex active: 315163}.  Last Pap: 02/28/19 Results were: no abnormalities /neg HPV DNA  01/12/2018 Colposcopy negative 09/05/2017 ASCUS HPV negative 08/04/2016 Colpo with CIN I negative ECC 05/27/2016 LGSIL HPV positive pap  Hx of STDs: HPV  Last mammogram: never There is a FH of breast cancer in her pat aunt and colon cancer in her dad age 68. Pt qualifies for cancer genetic testing but doesn't meet Medicare guidelines. There is no FH of ovarian cancer. The patient {does:18564} do self-breast exams.  Tobacco use: {tob:20664} Alcohol use: {Alcohol:11675} No drug use.  Exercise: {exercise:31265}  She {does:18564} get adequate calcium and Vitamin D in her diet.  Patient Active Problem List   Diagnosis Date Noted   Anxiety and depression 04/16/2022   Psychophysiological insomnia 04/16/2022   Need for immunization against influenza 04/16/2022   Dysplasia of cervix, low grade (CIN 1) 04/07/2022    Past Surgical History:  Procedure Laterality Date   CESAREAN SECTION     COLPOSCOPY  07/2016   cin 1   DILATION AND CURETTAGE OF UTERUS  2012   WISDOM TOOTH EXTRACTION     age 65    Family History  Problem Relation Age of Onset   Cancer Mother        lung   Heart failure Mother    Heart disease Father    Cancer Father 40       colon   Breast cancer Paternal Aunt 65   Stomach cancer Paternal Grandmother     Social History   Socioeconomic History   Marital status: Single    Spouse name: Not on file   Number of children: Not on file   Years of education:  Not on file   Highest education level: Not on file  Occupational History   Not on file  Tobacco Use   Smoking status: Every Day    Years: 30.00    Types: Cigarettes   Smokeless tobacco: Never  Vaping Use   Vaping Use: Every day  Substance and Sexual Activity   Alcohol use: Yes   Drug use: No   Sexual activity: Yes    Birth control/protection: Condom  Other Topics Concern   Not on file  Social History Narrative   Not on file   Social Determinants of Health   Financial Resource Strain: Not on file  Food Insecurity: Not on file  Transportation Needs: Not on file  Physical Activity: Not on file  Stress: Not on file  Social Connections: Not on file  Intimate Partner Violence: Not on file     Current Outpatient Medications:    Daridorexant HCl (QUVIVIQ) 50 MG TABS, Take 1 tablet by mouth at bedtime as needed., Disp: 90 tablet, Rfl: 0   sertraline (ZOLOFT) 25 MG tablet, Take 1 tablet (25 mg total) by mouth daily., Disp: 30 tablet, Rfl: 3     ROS:  Review of Systems BREAST: No symptoms   Objective: There were no vitals taken for this visit.   OBGyn Exam  Results: No results found for this  or any previous visit (from the past 24 hour(s)).  Assessment/Plan: No diagnosis found.  No orders of the defined types were placed in this encounter.            GYN counsel {counseling: 16159}     F/U  No follow-ups on file.  Kristain Hu B. Spencer Cardinal, PA-C 05/11/2022 10:09 AM

## 2022-05-17 ENCOUNTER — Ambulatory Visit: Payer: Self-pay | Admitting: Obstetrics and Gynecology

## 2022-05-17 DIAGNOSIS — Z01419 Encounter for gynecological examination (general) (routine) without abnormal findings: Secondary | ICD-10-CM

## 2022-05-17 DIAGNOSIS — Z1231 Encounter for screening mammogram for malignant neoplasm of breast: Secondary | ICD-10-CM

## 2022-05-17 DIAGNOSIS — N870 Mild cervical dysplasia: Secondary | ICD-10-CM

## 2022-05-17 DIAGNOSIS — Z124 Encounter for screening for malignant neoplasm of cervix: Secondary | ICD-10-CM

## 2022-05-17 DIAGNOSIS — Z803 Family history of malignant neoplasm of breast: Secondary | ICD-10-CM

## 2022-05-17 DIAGNOSIS — Z1151 Encounter for screening for human papillomavirus (HPV): Secondary | ICD-10-CM

## 2022-05-28 ENCOUNTER — Ambulatory Visit (INDEPENDENT_AMBULATORY_CARE_PROVIDER_SITE_OTHER): Payer: Medicare HMO | Admitting: Family Medicine

## 2022-05-28 ENCOUNTER — Ambulatory Visit
Admission: RE | Admit: 2022-05-28 | Discharge: 2022-05-28 | Disposition: A | Discharge status: Home / Self Care | Payer: Medicare HMO | Attending: Family Medicine | Admitting: Family Medicine

## 2022-05-28 ENCOUNTER — Ambulatory Visit
Admission: RE | Admit: 2022-05-28 | Discharge: 2022-05-28 | Disposition: A | Discharge status: Home / Self Care | Payer: Medicare HMO | Source: Ambulatory Visit | Attending: Family Medicine | Admitting: Family Medicine

## 2022-05-28 ENCOUNTER — Encounter: Payer: Self-pay | Admitting: Family Medicine

## 2022-05-28 VITALS — HR 98 | Ht 60.0 in | Wt 122.0 lb

## 2022-05-28 DIAGNOSIS — M5417 Radiculopathy, lumbosacral region: Secondary | ICD-10-CM | POA: Insufficient documentation

## 2022-05-28 DIAGNOSIS — M545 Low back pain, unspecified: Secondary | ICD-10-CM | POA: Diagnosis not present

## 2022-05-28 MED ORDER — PREDNISONE 50 MG PO TABS: 50.0000 mg | ORAL_TABLET | Freq: Every day | ORAL | 0 refills | Status: DC

## 2022-05-28 MED ORDER — CYCLOBENZAPRINE HCL 5 MG PO TABS: 5.0000 mg | ORAL_TABLET | Freq: Every evening | ORAL | 1 refills | Status: DC | PRN

## 2022-05-28 MED ORDER — MELOXICAM 15 MG PO TABS: 15.0000 mg | ORAL_TABLET | Freq: Every day | ORAL | 1 refills | Status: DC

## 2022-05-28 NOTE — Patient Instructions (Addendum)
-   Obtain low back x-ray today - Dose meloxicam once daily with food - If symptoms fail to improve by early next week, transition to prednisone and take for full course (stop meloxicam while this medication and restart after prednisone complete) - Can dose nightly cyclobenzaprine as needed, 1-2 tabs - Referral coordinator will contact to schedule physical therapy -Return for follow-up in 6 weeks

## 2022-05-28 NOTE — Assessment & Plan Note (Signed)
Work-related injury from 04/23/2022.  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  Denies any symptoms on the right.  Has had minor similar symptoms roughly 1 year prior, again while unloading a truck, resolves with time off.  Currently pain with ADLs including walking, bending, standing, twisting.  Has trialed ibuprofen 4 mg 1 time and home stretches.  Denies any bowel/bladder dysfunction.  Examination with limited range of motion due to pain and stiffness, positive left-sided straight leg raise, positive Faber and piriformis stretching, tenderness at the inferior aspect of the left SI joint, otherwise exam benign and sensorimotor otherwise intact.  Plan for course of meloxicam, low threshold to advance to prednisone, cyclobenzaprine, and formal physical therapy.  Lumbar x-rays ordered today.  Plan for return in 6 weeks.

## 2022-05-28 NOTE — Progress Notes (Signed)
     Primary Care / Sports Medicine Office Visit  Patient Information:  Patient ID: Erica Parker, female DOB: [birth year] Age: 41 y.o. MRN: [id number]   Erica Parker is a pleasant 41 y.o. female presenting with the following:  Chief Complaint  Patient presents with   Back Pain    Vitals:   05/28/22 1008  Pulse: 98  SpO2: 94%   Vitals:   05/28/22 1008  Weight: 122 lb (55.3 kg)  Height: 5' (1.524 m)   Body mass index is 23.83 kg/m.  No results found.   Independent interpretation of notes and tests performed by another provider:   None  Procedures performed:   None  Pertinent History, Exam, Impression, and Recommendations:   Problem List Items Addressed This Visit       Nervous and Auditory   Lumbosacral radiculopathy - Primary    Work-related injury from 04/23/2022.  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  Denies any symptoms on the right.  Has had minor similar symptoms roughly 1 year prior, again while unloading a truck, resolves with time off.  Currently pain with ADLs including walking, bending, standing, twisting.  Has trialed ibuprofen 4 mg 1 time and home stretches.  Denies any bowel/bladder dysfunction.  Examination with limited range of motion due to pain and stiffness, positive left-sided straight leg raise, positive Faber and piriformis stretching, tenderness at the inferior aspect of the left SI joint, otherwise exam benign and sensorimotor otherwise intact.  Plan for course of meloxicam, low threshold to advance to prednisone, cyclobenzaprine, and formal physical therapy.  Lumbar x-rays ordered today.  Plan for return in 6 weeks.      Relevant Medications   meloxicam (MOBIC) 15 MG tablet   cyclobenzaprine (FLEXERIL) 5  MG tablet   predniSONE (DELTASONE) 50 MG tablet   Other Relevant Orders   DG Lumbar Spine Complete   Ambulatory referral to Physical Therapy     Orders & Medications Meds ordered this encounter  Medications   meloxicam (MOBIC) 15 MG tablet    Sig: Take 1 tablet (15 mg total) by mouth daily.    Dispense:  30 tablet    Refill:  1   cyclobenzaprine (FLEXERIL) 5 MG tablet    Sig: Take 1-2 tablets (5-10 mg total) by mouth at bedtime as needed for muscle spasms.    Dispense:  30 tablet    Refill:  1   predniSONE (DELTASONE) 50 MG tablet    Sig: Take 1 tablet (50 mg total) by mouth daily.    Dispense:  5 tablet    Refill:  0   Orders Placed This Encounter  Procedures   DG Lumbar Spine Complete   Ambulatory referral to Physical Therapy     Return in about 6 weeks (around 07/09/2022).     Montel Culver, MD   Primary Care Sports Medicine [place]

## 2022-06-17 ENCOUNTER — Telehealth: Payer: Self-pay | Admitting: Family Medicine

## 2022-06-17 NOTE — Telephone Encounter (Signed)
Copied from [place] [id number]. Topic: Medicare AWV >> Jun 17, 2022  2:25 PM Devoria Glassing wrote: Reason for CRM: Left message tor patient to schedule Medicare Annual Wellness Visit (AWV) with [facility] Health Advisor.  Appointment can be an offiice/telephone or virtual visit;  Please call [phone] ask for [facility].

## 2022-07-06 ENCOUNTER — Telehealth: Payer: Self-pay | Admitting: Family Medicine

## 2022-07-06 NOTE — Telephone Encounter (Signed)
Copied from [place] [id number]. Topic: Medicare AWV >> Jul 06, 2022 10:36 AM Devoria Glassing wrote: Reason for CRM: Left message tor patient to schedule Medicare Annual Wellness Visit (AWV) with [facility] Health Advisor.  Appointment can be an offiice/telephone or virtual visit;  Please call [phone] ask for [facility].

## 2022-07-07 NOTE — Progress Notes (Unsigned)
PCP:  Montel Culver, MD   No chief complaint on file.    HPI:      [name] is a 41 y.o. [id number] whose LMP was Patient's last menstrual period was 11/09/2018 (approximate)., presents today for her NP> 3 yr medicare annual examination.  Her menses are {norm/abn:715}, lasting {number: 22536} days.  Dysmenorrhea {dysmen:716}. She {does:18564} have intermenstrual bleeding.  Sex activity: {sex active: 315163}.  Last Pap: 02/28/19 Results were: no abnormalities /neg HPV DNA  Hx of STDs: {STD hx:14358}  Last mammogram: {date:304500300}  Results were: {norm/abn:13465} There is no FH of breast cancer. There is no FH of ovarian cancer. The patient {does:18564} do self-breast exams.  Tobacco use: {tob:20664} Alcohol use: {Alcohol:11675} No drug use.  Exercise: {exercise:31265}  She {does:18564} get adequate calcium and Vitamin D in her diet.  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/07/2022    Past Surgical History:  Procedure Laterality Date   CESAREAN SECTION     COLPOSCOPY  07/2016   cin 1   DILATION AND CURETTAGE OF UTERUS  2012   WISDOM TOOTH EXTRACTION     age 9    Family History  Problem Relation Age of Onset   Cancer Mother        lung   Heart failure Mother    Heart disease Father    Cancer Father 67       colon   Breast cancer Paternal Aunt 50   Stomach cancer Paternal Grandmother     Social History   Socioeconomic History   Marital status: Single    Spouse name: Not on file   Number of children: Not on file   Years of education: Not on file   Highest education level: Not on file  Occupational History   Not on file  Tobacco Use   Smoking status: Every Day    Years: 30.00    Types: Cigarettes   Smokeless  tobacco: Never  Vaping Use   Vaping Use: Every day  Substance and Sexual Activity   Alcohol use: Yes   Drug use: No   Sexual activity: Yes    Birth control/protection: Condom  Other Topics Concern   Not on file  Social History Narrative   Not on file   Social Determinants of Health   Financial Resource Strain: Not on file  Food Insecurity: Not on file  Transportation Needs: Not on file  Physical Activity: Not on file  Stress: Not on file  Social Connections: Not on file  Intimate Partner Violence: Not on file     Current Outpatient Medications:    cyclobenzaprine (FLEXERIL) 5 MG tablet, Take 1-2 tablets (5-10 mg total) by mouth at bedtime as needed for muscle spasms., Disp: 30 tablet, Rfl: 1   meloxicam (MOBIC) 15 MG tablet, Take 1 tablet (15 mg total) by mouth daily., Disp: 30 tablet, Rfl: 1   predniSONE (DELTASONE) 50 MG tablet, Take 1 tablet (50 mg total) by mouth daily., Disp: 5 tablet, Rfl: 0   sertraline (ZOLOFT) 25 MG tablet, Take 1 tablet (25 mg total) by mouth daily. (Patient not taking: Reported on 05/28/2022), Disp: 30 tablet, Rfl: 3     ROS:  Review of Systems BREAST: No symptoms   Objective: LMP 11/09/2018 (Approximate)  OBGyn Exam  Results: No results found for this or any previous visit (from the past 24 hour(s)).  Assessment/Plan: No diagnosis found.  No orders of the defined types were placed in this encounter.            GYN counsel {counseling: 16159}     F/U  No follow-ups on file.  Maleta Pacha B. Jaydien Panepinto, PA-C 07/07/2022 3:06 PM

## 2022-07-08 ENCOUNTER — Other Ambulatory Visit: Payer: Self-pay

## 2022-07-08 ENCOUNTER — Ambulatory Visit (INDEPENDENT_AMBULATORY_CARE_PROVIDER_SITE_OTHER): Payer: Medicare HMO | Admitting: Obstetrics and Gynecology

## 2022-07-08 ENCOUNTER — Other Ambulatory Visit (HOSPITAL_COMMUNITY)
Admission: RE | Admit: 2022-07-08 | Discharge: 2022-07-08 | Disposition: A | Discharge status: Home / Self Care | Payer: Medicare HMO | Source: Ambulatory Visit | Attending: Obstetrics and Gynecology | Admitting: Obstetrics and Gynecology

## 2022-07-08 ENCOUNTER — Encounter: Payer: Self-pay | Admitting: Obstetrics and Gynecology

## 2022-07-08 ENCOUNTER — Telehealth: Payer: Self-pay

## 2022-07-08 VITALS — BP 100/70 | Ht 60.0 in | Wt 123.0 lb

## 2022-07-08 DIAGNOSIS — Z01419 Encounter for gynecological examination (general) (routine) without abnormal findings: Secondary | ICD-10-CM

## 2022-07-08 DIAGNOSIS — Z800 Family history of malignant neoplasm of digestive organs: Secondary | ICD-10-CM | POA: Diagnosis not present

## 2022-07-08 DIAGNOSIS — Z1231 Encounter for screening mammogram for malignant neoplasm of breast: Secondary | ICD-10-CM

## 2022-07-08 DIAGNOSIS — Z124 Encounter for screening for malignant neoplasm of cervix: Secondary | ICD-10-CM | POA: Insufficient documentation

## 2022-07-08 DIAGNOSIS — Z1211 Encounter for screening for malignant neoplasm of colon: Secondary | ICD-10-CM | POA: Diagnosis not present

## 2022-07-08 DIAGNOSIS — Z87410 Personal history of cervical dysplasia: Secondary | ICD-10-CM

## 2022-07-08 DIAGNOSIS — Z803 Family history of malignant neoplasm of breast: Secondary | ICD-10-CM | POA: Diagnosis not present

## 2022-07-08 DIAGNOSIS — Z1151 Encounter for screening for human papillomavirus (HPV): Secondary | ICD-10-CM

## 2022-07-08 DIAGNOSIS — N912 Amenorrhea, unspecified: Secondary | ICD-10-CM

## 2022-07-08 DIAGNOSIS — Z780 Asymptomatic menopausal state: Secondary | ICD-10-CM | POA: Diagnosis not present

## 2022-07-08 MED ORDER — CLENPIQ 10-3.5-12 MG-GM -GM/160ML PO SOLN: 1.0000 | Freq: Once | ORAL | 0 refills | Status: AC

## 2022-07-08 NOTE — Patient Instructions (Addendum)
I value your feedback and you entrusting us with your care. If you get a [facility] patient survey, I would appreciate you taking the time to let us know about your experience today. Thank you!  [facility]: [phone]  [facility]: [phone]    

## 2022-07-08 NOTE — Telephone Encounter (Signed)
Gastroenterology Pre-Procedure Review  Request Date: 07/23/22 Requesting Physician: Dr. Allen Norris  PATIENT REVIEW QUESTIONS: The patient responded to the following health history questions as indicated:    1. Are you having any GI issues? no 2. Do you have a personal history of Polyps? no 3. Do you have a family history of Colon Cancer or Polyps? yes (father colon cancer) 4. Diabetes Mellitus? no 5. Joint replacements in the past 12 months?no 6. Major health problems in the past 3 months?no 7. Any artificial heart valves, MVP, or defibrillator?no    MEDICATIONS & ALLERGIES:    Patient reports the following regarding taking any anticoagulation/antiplatelet therapy:   Plavix, Coumadin, Eliquis, Xarelto, Lovenox, Pradaxa, Brilinta, or Effient? no Aspirin? no  Patient confirms/reports the following medications:  Current Outpatient Medications  Medication Sig Dispense Refill   meloxicam (MOBIC) 15 MG tablet Take 1 tablet (15 mg total) by mouth daily. 30 tablet 1   sertraline (ZOLOFT) 25 MG tablet Take 1 tablet (25 mg total) by mouth daily. 30 tablet 3   No current facility-administered medications for this visit.    Patient confirms/reports the following allergies:  Allergies  Allergen Reactions   Sulfa Antibiotics Other (See Comments)    No orders of the defined types were placed in this encounter.   AUTHORIZATION INFORMATION Primary Insurance: 1D#: Group #:  Secondary Insurance: 1D#: Group #:  SCHEDULE INFORMATION: Date: 07/23/22 Time: Location: [place]

## 2022-07-09 DIAGNOSIS — E28319 Asymptomatic premature menopause: Secondary | ICD-10-CM

## 2022-07-09 HISTORY — DX: Asymptomatic premature menopause: E28.319

## 2022-07-09 LAB — CYTOLOGY - PAP
Comment: NEGATIVE
Diagnosis: NEGATIVE
High risk HPV: NEGATIVE

## 2022-07-09 LAB — TSH: TSH: 0.79 u[IU]/mL (ref 0.450–4.500)

## 2022-07-09 LAB — FOLLICLE STIMULATING HORMONE: FSH: 81.5 m[IU]/mL

## 2022-07-09 LAB — ESTRADIOL: Estradiol: <5 pg/mL

## 2022-07-11 ENCOUNTER — Encounter: Payer: Self-pay | Admitting: Obstetrics and Gynecology

## 2022-07-16 ENCOUNTER — Encounter: Payer: Self-pay | Admitting: Family Medicine

## 2022-07-16 ENCOUNTER — Ambulatory Visit (INDEPENDENT_AMBULATORY_CARE_PROVIDER_SITE_OTHER): Payer: Medicare HMO | Admitting: Family Medicine

## 2022-07-16 VITALS — BP 140/98 | HR 94 | Ht 60.0 in | Wt 127.0 lb

## 2022-07-16 DIAGNOSIS — Z13220 Encounter for screening for lipoid disorders: Secondary | ICD-10-CM | POA: Diagnosis not present

## 2022-07-16 DIAGNOSIS — Z0000 Encounter for general adult medical examination without abnormal findings: Secondary | ICD-10-CM | POA: Diagnosis not present

## 2022-07-16 DIAGNOSIS — F32A Depression, unspecified: Secondary | ICD-10-CM | POA: Diagnosis not present

## 2022-07-16 DIAGNOSIS — F419 Anxiety disorder, unspecified: Secondary | ICD-10-CM | POA: Diagnosis not present

## 2022-07-16 DIAGNOSIS — M4727 Other spondylosis with radiculopathy, lumbosacral region: Secondary | ICD-10-CM

## 2022-07-16 DIAGNOSIS — Z114 Encounter for screening for human immunodeficiency virus [HIV]: Secondary | ICD-10-CM

## 2022-07-16 DIAGNOSIS — F5104 Psychophysiologic insomnia: Secondary | ICD-10-CM | POA: Diagnosis not present

## 2022-07-16 DIAGNOSIS — Z1159 Encounter for screening for other viral diseases: Secondary | ICD-10-CM | POA: Diagnosis not present

## 2022-07-16 MED ORDER — MELOXICAM 15 MG PO TABS: 15.0000 mg | ORAL_TABLET | Freq: Every day | ORAL | 0 refills | Status: DC | PRN

## 2022-07-16 MED ORDER — CYCLOBENZAPRINE HCL 5 MG PO TABS: 5.0000 mg | ORAL_TABLET | Freq: Every evening | ORAL | 1 refills | Status: DC | PRN

## 2022-07-16 MED ORDER — DULOXETINE HCL 30 MG PO CPEP: 30.0000 mg | ORAL_CAPSULE | Freq: Every day | ORAL | 0 refills | Status: DC

## 2022-07-16 NOTE — Patient Instructions (Addendum)
-   Obtain fasting labs with orders provided (can have water or black coffee but otherwise no food or drink x 8 hours before labs) - Review information provided - Attend eye doctor annually, dentist every 6 months, work towards or maintain 30 minutes of moderate intensity physical activity at least 5 days per week, and consume a balanced diet - Start duloxetine (Cymbalta) nightly - Continue sertraline (Zoloft) - Utilize meloxicam daily as needed - Utilize cyclobenzaprine nightly as needed - Start physical therapy - Return in 2 months - Contact us for any questions between now and then

## 2022-07-18 DIAGNOSIS — Z0000 Encounter for general adult medical examination without abnormal findings: Secondary | ICD-10-CM | POA: Insufficient documentation

## 2022-07-18 NOTE — Progress Notes (Signed)
Annual Physical Exam Visit  Patient Information:  Patient ID: Erica Parker, female DOB: 1980/12/23 Age: 41 y.o. MRN: [id number]   Subjective:   CC: Annual Physical Exam  HPI:  Erica Parker is here for their annual physical.  I reviewed the past medical history, family history, social history, surgical history, and allergies today and changes were made as necessary.  Please see the problem list section below for additional details.  Past Medical History: 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   Premature menopause 07/2022   confirmed with labs/sx   Past Surgical History: Past Surgical History:  Procedure Laterality Date   CESAREAN SECTION     COLPOSCOPY  07/2016   cin 1   DILATION AND CURETTAGE OF UTERUS  2012   WISDOM TOOTH EXTRACTION     age 68   Family History: Family History  Problem Relation Age of Onset   Heart failure Mother    Lung cancer Mother    Ovarian cancer Mother        Believes ovarian cancer in 22s   Heart disease Father    Colon cancer Father 58   Breast cancer Paternal Aunt 63   Stomach cancer Paternal Grandmother    Allergies: Allergies  Allergen Reactions   Sulfa Antibiotics Other (See Comments)   Health Maintenance: Health Maintenance  Topic Date Due   Medicare Annual Wellness (AWV)  Never done   Hepatitis C Screening  04/17/2023 (Originally 03/02/1997)   COVID-19 Vaccine (1) 08/01/2026 (Originally 09/03/1979)   PAP SMEAR-Modifier  07/09/2027   INFLUENZA VACCINE  Completed   HIV Screening  Completed   HPV VACCINES  Aged Out   DTaP/Tdap/Td  Discontinued    HM Colonoscopy     This patient has no relevant Health Maintenance data.      Medications: Current Outpatient Medications on File Prior to Visit  Medication Sig Dispense Refill   sertraline (ZOLOFT) 25 MG tablet Take 1 tablet (25 mg total)  by mouth daily. 30 tablet 3   No current facility-administered medications on file prior to visit.    Review of Systems: No headache, visual changes, nausea, vomiting, diarrhea, constipation, dizziness, abdominal pain, skin rash, fevers, chills, night sweats, swollen lymph nodes, weight loss, chest pain, body aches, joint swelling, muscle aches, shortness of breath, mood changes, visual or auditory hallucinations reported.  Objective:   Vitals:   07/16/22 0914 07/16/22 0916  BP: (!) 146/112 (!) 140/98  Pulse: 94   SpO2: 97%    Vitals:   07/16/22 0914  Weight: 127 lb (57.6 kg)  Height: 5' (1.524 m)   Body mass index is 24.8 kg/m.  General: Well Developed, well nourished, and in no acute distress.  Neuro: Alert and oriented x3, extra-ocular muscles intact, sensation grossly intact. Cranial nerves II through XII are grossly intact, motor, sensory, and coordinative functions are intact. HEENT: Normocephalic, atraumatic, pupils equal round reactive to light, neck supple, no masses, no lymphadenopathy, thyroid nonpalpable. Oropharynx, nasopharynx, external ear canals are unremarkable. Skin: Warm and dry, no rashes noted.  Cardiac: Regular rate and rhythm, no murmurs rubs or gallops. No peripheral edema. Pulses symmetric. Respiratory: Clear to auscultation bilaterally. Not using accessory muscles, speaking in full sentences.  Abdominal: Soft, nontender, nondistended, positive bowel sounds, no masses, no organomegaly. Musculoskeletal: Shoulder, elbow,  wrist, hip, knee, ankle stable, and with full range of motion.  Female chaperone initials: AH present throughout the physical examination.  Impression and Recommendations:   The patient was counselled, risk factors were discussed, and anticipatory guidance given.  Problem List Items Addressed This Visit       Nervous and Auditory   Lumbosacral radiculopathy    Given x-ray findings, discussed options, plan for meloxicam,  cyclobenzaprine, initiate duloxetine and advised her to commence PT.      Relevant Medications   meloxicam (MOBIC) 15 MG tablet   cyclobenzaprine (FLEXERIL) 5 MG tablet   DULoxetine (CYMBALTA) 30 MG capsule     Other   Anxiety and depression    Discussed options, titration of sertraline vs adjunct treatment. Given comorbid lumbosacral radiculopathy duloxetine adjunct advised. Will coordinate follow-up to assess response.      Relevant Medications   DULoxetine (CYMBALTA) 30 MG capsule   Psychophysiological insomnia    Anticipate improvement once tighter anxiety / depression control achieved.      Annual physical exam - Primary    Annual examination completed, risk stratification labs ordered, anticipatory guidance provided.  We will follow labs once resulted.      Relevant Orders   CBC   Comprehensive metabolic panel   Hepatitis C antibody   HIV Antibody (routine testing w rflx)   Lipid panel   TSH   Other Visit Diagnoses     Screening for HIV (human immunodeficiency virus)       Relevant Orders   HIV Antibody (routine testing w rflx)   Need for hepatitis C screening test       Relevant Orders   Hepatitis C antibody   Screening for lipoid disorders       Relevant Orders   Comprehensive metabolic panel   Lipid panel        Orders & Medications Medications:  Meds ordered this encounter  Medications   meloxicam (MOBIC) 15 MG tablet    Sig: Take 1 tablet (15 mg total) by mouth daily as needed for pain.    Dispense:  60 tablet    Refill:  0   cyclobenzaprine (FLEXERIL) 5 MG tablet    Sig: Take 1-2 tablets (5-10 mg total) by mouth at bedtime as needed for muscle spasms.    Dispense:  30 tablet    Refill:  1   DULoxetine (CYMBALTA) 30 MG capsule    Sig: Take 1 capsule (30 mg total) by mouth daily.    Dispense:  60 capsule    Refill:  0   Orders Placed This Encounter  Procedures   CBC   Comprehensive metabolic panel   Hepatitis C antibody   HIV Antibody  (routine testing w rflx)   Lipid panel   TSH     Return in about 2 months (around 09/16/2022) for f/u low back and medications.    Montel Culver, MD, [facility]   Primary Care Sports Medicine Primary Care and Sports Medicine at [facility]

## 2022-07-18 NOTE — Assessment & Plan Note (Signed)
Annual examination completed, risk stratification labs ordered, anticipatory guidance provided.  We will follow labs once resulted. 

## 2022-07-18 NOTE — Assessment & Plan Note (Signed)
Discussed options, titration of sertraline vs adjunct treatment. Given comorbid lumbosacral radiculopathy duloxetine adjunct advised. Will coordinate follow-up to assess response.

## 2022-07-18 NOTE — Assessment & Plan Note (Signed)
Anticipate improvement once tighter anxiety / depression control achieved.

## 2022-07-18 NOTE — Assessment & Plan Note (Signed)
Given x-ray findings, discussed options, plan for meloxicam, cyclobenzaprine, initiate duloxetine and advised her to commence PT.

## 2022-07-30 ENCOUNTER — Encounter: Payer: Self-pay | Admitting: Gastroenterology

## 2022-07-30 ENCOUNTER — Ambulatory Visit
Admission: RE | Admit: 2022-07-30 | Discharge: 2022-07-30 | Disposition: A | Discharge status: Home / Self Care | Payer: Medicare HMO | Source: Ambulatory Visit | Attending: Gastroenterology | Admitting: Gastroenterology

## 2022-07-30 ENCOUNTER — Ambulatory Visit: Payer: Medicare HMO | Admitting: Anesthesiology

## 2022-07-30 ENCOUNTER — Other Ambulatory Visit: Payer: Self-pay

## 2022-07-30 ENCOUNTER — Encounter
Admission: RE | Disposition: A | Discharge status: Home / Self Care | Payer: Self-pay | Source: Ambulatory Visit | Attending: Gastroenterology

## 2022-07-30 DIAGNOSIS — Z800 Family history of malignant neoplasm of digestive organs: Secondary | ICD-10-CM | POA: Diagnosis not present

## 2022-07-30 DIAGNOSIS — D125 Benign neoplasm of sigmoid colon: Secondary | ICD-10-CM | POA: Insufficient documentation

## 2022-07-30 DIAGNOSIS — F419 Anxiety disorder, unspecified: Secondary | ICD-10-CM | POA: Insufficient documentation

## 2022-07-30 DIAGNOSIS — F32A Depression, unspecified: Secondary | ICD-10-CM | POA: Insufficient documentation

## 2022-07-30 DIAGNOSIS — F17210 Nicotine dependence, cigarettes, uncomplicated: Secondary | ICD-10-CM | POA: Insufficient documentation

## 2022-07-30 DIAGNOSIS — Z1211 Encounter for screening for malignant neoplasm of colon: Secondary | ICD-10-CM | POA: Insufficient documentation

## 2022-07-30 DIAGNOSIS — K635 Polyp of colon: Secondary | ICD-10-CM

## 2022-07-30 DIAGNOSIS — F418 Other specified anxiety disorders: Secondary | ICD-10-CM | POA: Diagnosis not present

## 2022-07-30 DIAGNOSIS — D124 Benign neoplasm of descending colon: Secondary | ICD-10-CM | POA: Insufficient documentation

## 2022-07-30 HISTORY — PX: POLYPECTOMY: SHX5525

## 2022-07-30 HISTORY — PX: COLONOSCOPY WITH PROPOFOL: SHX5780

## 2022-07-30 SURGERY — COLONOSCOPY WITH PROPOFOL
Anesthesia: General | Site: Rectum

## 2022-07-30 MED ORDER — STERILE WATER FOR IRRIGATION IR SOLN
Status: DC | PRN
  Administered 2022-07-30: 100 mL

## 2022-07-30 MED ORDER — SODIUM CHLORIDE 0.9 % IV SOLN: INTRAVENOUS | Status: DC

## 2022-07-30 MED ORDER — LIDOCAINE HCL (CARDIAC) PF 100 MG/5ML IV SOSY
PREFILLED_SYRINGE | INTRAVENOUS | Status: DC | PRN
  Administered 2022-07-30: 60 mg via INTRAVENOUS

## 2022-07-30 MED ORDER — LACTATED RINGERS IV SOLN: INTRAVENOUS | Status: DC

## 2022-07-30 MED ORDER — PROPOFOL 10 MG/ML IV BOLUS
INTRAVENOUS | Status: DC | PRN
  Administered 2022-07-30: 40 mg via INTRAVENOUS
  Administered 2022-07-30: 20 mg via INTRAVENOUS
  Administered 2022-07-30: 80 mg via INTRAVENOUS
  Administered 2022-07-30: 40 mg via INTRAVENOUS

## 2022-07-30 SURGICAL SUPPLY — 21 items

## 2022-07-30 NOTE — Transfer of Care (Signed)
Immediate Anesthesia Transfer of Care Note  Patient: Erica Parker  Procedure(s) Performed: COLONOSCOPY WITH PROPOFOL POLYPECTOMY (Rectum)  Patient Location: PACU  Anesthesia Type: General  Level of Consciousness: awake, alert  and patient cooperative  Airway and Oxygen Therapy: Patient Spontanous Breathing and Patient connected to supplemental oxygen  Post-op Assessment: Post-op Vital signs reviewed, Patient's Cardiovascular Status Stable, Respiratory Function Stable, Patent Airway and No signs of Nausea or vomiting  Post-op Vital Signs: Reviewed and stable  Complications: No notable events documented.

## 2022-07-30 NOTE — H&P (Signed)
Lucilla Lame, MD [address] [place], [place] [address] Phone: [phone] Fax : [phone]  Primary Care Physician:  Montel Culver, MD Primary Gastroenterologist:  Dr. Allen Norris  Pre-Procedure History & Physical: HPI:  Erica Parker is a 41 y.o. female is here for a screening colonoscopy.   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   Premature menopause 07/2022   confirmed with labs/sx    Past Surgical History:  Procedure Laterality Date   CESAREAN SECTION     COLPOSCOPY  07/2016   cin 1   DILATION AND CURETTAGE OF UTERUS  2012   WISDOM TOOTH EXTRACTION     age 41    Prior to Admission medications   Medication Sig Start Date End Date Taking? Authorizing Provider  cyclobenzaprine (FLEXERIL) 5 MG tablet Take 1-2 tablets (5-10 mg total) by mouth at bedtime as needed for muscle spasms. 07/16/22  Yes Montel Culver, MD  meloxicam (MOBIC) 15 MG tablet Take 1 tablet (15 mg total) by mouth daily as needed for pain. 07/16/22  Yes Montel Culver, MD  sertraline (ZOLOFT) 25 MG tablet Take 1 tablet (25 mg total) by mouth daily. 04/16/22  Yes Montel Culver, MD  DULoxetine (CYMBALTA) 30 MG capsule Take 1 capsule (30 mg total) by mouth daily. 07/16/22   Montel Culver, MD    Allergies as of 07/08/2022 - Review Complete 07/08/2022  Allergen Reaction Noted   Sulfa antibiotics Other (See Comments) 07/08/2022    Family History  Problem Relation Age of Onset   Heart failure Mother    Lung cancer Mother    Ovarian cancer Mother        Believes ovarian cancer in 31s   Heart disease Father    Colon cancer Father 73   Breast cancer Paternal Aunt 78   Stomach cancer Paternal Grandmother     Social History   Socioeconomic History   Marital status: Single    Spouse name: Not on file   Number of children: Not on file   Years of  education: Not on file   Highest education level: Not on file  Occupational History   Not on file  Tobacco Use   Smoking status: Every Day    Years: 30.00    Types: Cigarettes   Smokeless tobacco: Never  Vaping Use   Vaping Use: Every day  Substance and Sexual Activity   Alcohol use: Yes   Drug use: No   Sexual activity: Yes    Birth control/protection: None  Other Topics Concern   Not on file  Social History Narrative   Not on file   Social Determinants of Health   Financial Resource Strain: Not on file  Food Insecurity: Not on file  Transportation Needs: Not on file  Physical Activity: Not on file  Stress: Not on file  Social Connections: Not on file  Intimate Partner Violence: Not on file    Review of Systems: See HPI, otherwise negative ROS  Physical Exam: BP 104/82   Pulse 71   Temp 98.2 F (36.8 C) (Tympanic)   Ht 5' (1.524 m)   Wt 55.3 kg   LMP 11/09/2018 (Approximate)   SpO2 97%   BMI 23.83 kg/m  General:   Alert,  pleasant and cooperative in NAD Head:  Normocephalic and atraumatic. Neck:  Supple; no masses or thyromegaly. Lungs:  Clear throughout to auscultation.    Heart:  Regular rate and rhythm. Abdomen:  Soft, nontender and nondistended. Normal bowel sounds, without guarding, and without rebound.   Neurologic:  Alert and  oriented x4;  grossly normal neurologically.  Impression/Plan: Erica Parker is now here to undergo a screening colonoscopy.  Risks, benefits, and alternatives regarding colonoscopy have been reviewed with the patient.  Questions have been answered.  All parties agreeable.

## 2022-07-30 NOTE — Anesthesia Preprocedure Evaluation (Signed)
Anesthesia Evaluation  Patient identified by MRN, date of birth, ID band Patient awake    Reviewed: Allergy & Precautions, NPO status , Patient's Chart, lab work & pertinent test results  History of Anesthesia Complications Negative for: history of anesthetic complications  Airway Mallampati: III  TM Distance: >3 FB Neck ROM: full    Dental no notable dental hx.    Pulmonary neg pulmonary ROS, Current Smoker   Pulmonary exam normal        Cardiovascular Exercise Tolerance: Good Normal cardiovascular exam     Neuro/Psych  PSYCHIATRIC DISORDERS Anxiety Depression     Neuromuscular disease    GI/Hepatic negative GI ROS, Neg liver ROS,,,  Endo/Other  negative endocrine ROS    Renal/GU negative Renal ROS  negative genitourinary   Musculoskeletal   Abdominal   Peds  Hematology negative hematology ROS (+)   Anesthesia Other Findings Past Medical History: No date: Anxiety No date: Depression 07/2016: Dysplasia of cervix, low grade (CIN 1)     Comment:  on colpo 05/2016: LGSIL on Pap smear of cervix 07/2022: Premature menopause     Comment:  confirmed with labs/sx  Past Surgical History: No date: CESAREAN SECTION 07/2016: COLPOSCOPY     Comment:  cin 1 2012: DILATION AND CURETTAGE OF UTERUS No date: WISDOM TOOTH EXTRACTION     Comment:  age 1  BMI    Body Mass Index: 24.03 kg/m      Reproductive/Obstetrics negative OB ROS                             Anesthesia Physical Anesthesia Plan  ASA: 2  Anesthesia Plan: General   Post-op Pain Management: Minimal or no pain anticipated   Induction: Intravenous  PONV Risk Score and Plan: Propofol infusion and TIVA  Airway Management Planned: Natural Airway and Nasal Cannula  Additional Equipment:   Intra-op Plan:   Post-operative Plan:   Informed Consent: I have reviewed the patients History and Physical, chart, labs and  discussed the procedure including the risks, benefits and alternatives for the proposed anesthesia with the patient or authorized representative who has indicated his/her understanding and acceptance.     Dental Advisory Given  Plan Discussed with: Anesthesiologist, CRNA and Surgeon  Anesthesia Plan Comments: (Patient consented for risks of anesthesia including but not limited to:  - adverse reactions to medications - risk of airway placement if required - damage to eyes, teeth, lips or other oral mucosa - nerve damage due to positioning  - sore throat or hoarseness - Damage to heart, brain, nerves, lungs, other parts of body or loss of life  Patient voiced understanding.)       Anesthesia Quick Evaluation

## 2022-07-30 NOTE — Op Note (Signed)
[facility] Gastroenterology Patient Name: Erica Parker Procedure Date: 07/30/2022 9:55 AM MRN: [id number] Account #: [id number] Date of Birth: 1980/11/17 Admit Type: Outpatient Age: 41 Room: [facility] OR ROOM 01 Gender: Female Note Status: Finalized Instrument Name: [id number] Procedure:             Colonoscopy Indications:           Screening for colorectal malignant neoplasm, Screening                         in patient at increased risk: Family history of                         1st-degree relative with colorectal cancer Providers:             Lucilla Lame MD, MD Referring MD:          Rosette Reveal Medicines:             Propofol per Anesthesia Complications:         No immediate complications. Procedure:             Pre-Anesthesia Assessment:                        - Prior to the procedure, a History and Physical was                         performed, and patient medications and allergies were                         reviewed. The patient's tolerance of previous                         anesthesia was also reviewed. The risks and benefits                         of the procedure and the sedation options and risks                         were discussed with the patient. All questions were                         answered, and informed consent was obtained. Prior                         Anticoagulants: The patient has taken no anticoagulant                         or antiplatelet agents. ASA Grade Assessment: II - A                         patient with mild systemic disease. After reviewing                         the risks and benefits, the patient was deemed in                         satisfactory condition to undergo the procedure.  After obtaining informed consent, the colonoscope was                         passed under direct vision. Throughout the procedure,                         the patient's blood pressure, pulse, and oxygen                          saturations were monitored continuously. The was                         introduced through the anus and advanced to the the                         cecum, identified by appendiceal orifice and ileocecal                         valve. The colonoscopy was performed without                         difficulty. The patient tolerated the procedure well.                         The quality of the bowel preparation was excellent. Findings:      The perianal and digital rectal examinations were normal.      A 3 mm polyp was found in the sigmoid colon. The polyp was sessile. The       polyp was removed with a cold biopsy forceps. Resection and retrieval       were complete.      Two sessile polyps were found in the descending colon. The polyps were 3       to 4 mm in size. These polyps were removed with a cold biopsy forceps.       Resection and retrieval were complete.      Non-bleeding internal hemorrhoids were found during retroflexion. The       hemorrhoids were Grade I (internal hemorrhoids that do not prolapse). Impression:            - One 3 mm polyp in the sigmoid colon, removed with a                         cold biopsy forceps. Resected and retrieved.                        - Two 3 to 4 mm polyps in the descending colon,                         removed with a cold biopsy forceps. Resected and                         retrieved.                        - Non-bleeding internal hemorrhoids. Recommendation:        - Discharge patient to home.                        -  Resume previous diet.                        - Continue present medications.                        - Await pathology results.                        - Repeat colonoscopy in 5 years for surveillance. Procedure Code(s):     --- Professional ---                        [phone], Colonoscopy, flexible; with biopsy, single or                         multiple Diagnosis Code(s):     --- Professional ---                         Z12.11, Encounter for screening for malignant neoplasm                         of colon                        D12.5, Benign neoplasm of sigmoid colon CPT copyright 2022 American Medical Association. All rights reserved. The codes documented in this report are preliminary and upon coder review may  be revised to meet current compliance requirements. Lucilla Lame MD, MD 07/30/2022 10:17:19 AM This report has been signed electronically. Number of Addenda: 0 Note Initiated On: 07/30/2022 9:55 AM Scope Withdrawal Time: 0 hours 8 minutes 5 seconds  Total Procedure Duration: 0 hours 10 minutes 14 seconds  Estimated Blood Loss:  Estimated blood loss: none.      [facility]

## 2022-07-30 NOTE — Anesthesia Postprocedure Evaluation (Signed)
Anesthesia Post Note  Patient: Erica Parker  Procedure(s) Performed: COLONOSCOPY WITH PROPOFOL POLYPECTOMY (Rectum)  Patient location during evaluation: PACU Anesthesia Type: General Level of consciousness: awake and alert Pain management: pain level controlled Vital Signs Assessment: post-procedure vital signs reviewed and stable Respiratory status: spontaneous breathing, nonlabored ventilation, respiratory function stable and patient connected to nasal cannula oxygen Cardiovascular status: blood pressure returned to baseline and stable Postop Assessment: no apparent nausea or vomiting Anesthetic complications: no   No notable events documented.   Last Vitals:  Vitals:   07/30/22 1018 07/30/22 1023  BP: 108/78 110/88  Pulse: 86 75  Resp: 14 17  Temp: (!) 36.2 C (!) 36.2 C  SpO2: 97% 100%    Last Pain:  Vitals:   07/30/22 1023  TempSrc:   PainSc: 0-No pain                 Ilene Qua

## 2022-08-03 ENCOUNTER — Encounter: Payer: Self-pay | Admitting: Gastroenterology

## 2022-08-03 LAB — SURGICAL PATHOLOGY

## 2022-08-04 ENCOUNTER — Ambulatory Visit: Payer: Medicare HMO

## 2022-08-04 ENCOUNTER — Encounter: Payer: Self-pay | Admitting: Gastroenterology

## 2022-08-16 ENCOUNTER — Ambulatory Visit (INDEPENDENT_AMBULATORY_CARE_PROVIDER_SITE_OTHER): Payer: Medicare HMO

## 2022-08-16 NOTE — Progress Notes (Signed)
 I connected with  Erica Parker on 09/20/2022 by a audio enabled telemedicine application and verified that I am speaking with the correct person using two identifiers.  Patient Location: Home  Provider Location: Home Office  I discussed the limitations of evaluation and management by telemedicine. The patient expressed understanding and agreed to proceed.  Subjective:   Erica Parker is a 42 y.o. female who presents for an Initial Medicare Annual Wellness Visit.  Review of Systems    Per HPI unless specifically indicated below.  Cardiac Risk Factors include: advanced age (>55men, >65 women);female gender, Hypertension, CAD, and Hyperlipidemia.          Objective:       09/15/2022   10:51 AM 08/27/2022    3:33 PM 08/25/2022    9:53 AM  Vitals with BMI  Height  5' 7"   Weight  143 lbs 143 lbs 14 oz  BMI  22.39 22.53  Systolic 142 122 136  Diastolic 61 68 65  Pulse 67 73 81    There were no vitals filed for this visit. There is no height or weight on file to calculate BMI.     09/15/2022   10:04 AM 08/27/2022    3:35 PM 07/20/2022    1:25 PM 07/14/2022   10:56 AM 07/07/2022   12:39 PM 06/23/2022    9:30 AM 06/02/2022   11:34 AM  Advanced Directives  Does Patient Have a Medical Advance Directive? No No No No No No No  Would patient like information on creating a medical advance directive? No - Patient declined No - Patient declined  No - Patient declined No - Patient declined No - Patient declined No - Patient declined    Current Medications (verified) Outpatient Encounter Medications as of 09/20/2022  Medication Sig   acetaminophen (TYLENOL) 500 MG tablet Take 500 mg by mouth every 6 (six) hours as needed.   amLODipine (NORVASC) 10 MG tablet TAKE 1 TABLET BY MOUTH ONCE DAILY . APPOINTMENT REQUIRED FOR FUTURE REFILLS   aspirin EC 81 MG tablet Take 1 tablet (81 mg total) by mouth daily.   atorvastatin (LIPITOR) 40 MG tablet Take 1 tablet by mouth once daily    Cholecalciferol (VITAMIN D3) 250 MCG (10000 UT) capsule Take 10,000 mcg by mouth daily.   diphenhydrAMINE HCl (BENADRYL ALLERGY PO) Take 25 mg by mouth at bedtime as needed (for sleep).   escitalopram (LEXAPRO) 10 MG tablet Take 1 tablet by mouth once daily   folic acid (FOLVITE) 1 MG tablet Take 1 tablet by mouth once daily   levothyroxine (SYNTHROID) 150 MCG tablet Take 1 tablet (150 mcg total) by mouth daily before breakfast.   lidocaine-prilocaine (EMLA) cream Apply 1 Application topically as needed.   losartan (COZAAR) 100 MG tablet Take 1 tablet by mouth once daily   metoprolol succinate (TOPROL-XL) 50 MG 24 hr tablet TAKE 1 TABLET BY MOUTH ONCE DAILY WITH A MEAL OR  IMMEDIATELY  FOLLOWING   Multiple Vitamins-Minerals (MULTIVITAMIN WITH MINERALS) tablet Take 1 tablet by mouth daily.   nitroGLYCERIN (NITROSTAT) 0.4 MG SL tablet Place 1 tablet (0.4 mg total) under the tongue every 5 (five) minutes as needed. For chest pain (Patient not taking: Reported on 07/29/2021)   prochlorperazine (COMPAZINE) 10 MG tablet Take 1 tablet (10 mg total) by mouth every 6 (six) hours as needed for nausea or vomiting.   Vitamin A 2400 MCG (8000 UT) TABS Take 2,400 mg by mouth daily.   No   facility-administered encounter medications on file as of 09/20/2022.    Allergies (verified) Varenicline, Ace inhibitors, Prednisone, Betalin 12 [vitamin b12], Penicillins, and Sulfa drugs cross reactors   History: Past Medical History:  Diagnosis Date   Anxiety    ON PAXIL, XANAX   AVM (arteriovenous malformation) brain    s/p stent/coil   Blood transfusion    x 2   Cancer (HCC)    Left lung   COPD (chronic obstructive pulmonary disease) (HCC)    no inhaler, no oxygen   Coronary artery disease    Prior inferior MI with stent to RCA, s/p CABG in 2008   Depression    Dizziness    Dyspnea    with exertion, no oxygen   Fatigue    Foot injury 03/04/2017   right - RESOLVED, no longer an issue per patient 02/18/21    GERD (gastroesophageal reflux disease)    Headache(784.0)    UNRUPTURED CEREBRAL ANEURYSM   Hyperlipidemia    Hypertension    Hypothyroidism    Infected cyst of skin 06/21/2013   Left knee pain 03/08/2012   Lung mass    Left lung   Myocardial infarction (HCC)    Normal nuclear stress test Ju;y 2012   No ischemia. EF 70%; fixed defect involving septum, inferoseptal and inferior wall   Pain, dental 11/09/2016   Poor dental hygiene    Retroperitoneal bleeding    Following cardiac cath   Tobacco abuse    Urine discoloration 06/21/2013   Urine incontinence 04/08/2020   UTI (lower urinary tract infection) 07/19/2013   Past Surgical History:  Procedure Laterality Date   CARDIAC CATHETERIZATION  10/05/2006   IT REVEALS MILD INFERIOR WALL HYPOKINESIS. THE EJECTION FRACTION IS AROUND 50%   COLONOSCOPY     CORONARY ARTERY BYPASS GRAFT  08/09/2006   LIMA to LAD, SVG to DX, SVG to LCX & SVG to OM 1 & 2, and SVG to PD   CORONARY STENT PLACEMENT     Remote past stent to RCA   EYE SURGERY Right    cataracts removed   HEMORRHOID SURGERY  08/10/1987   IR IMAGING GUIDED PORT INSERTION  07/07/2022   UPPER GI ENDOSCOPY     VENTRICULOSTOMY  07/09/2011   Procedure: VENTRICULOSTOMY;  Surgeon: Kyle L Cabbell;  Location: [facility];  Service: Neurosurgery;  Laterality: Right;  Insertion of Ventriculostomy Catheter   VIDEO BRONCHOSCOPY WITH ENDOBRONCHIAL NAVIGATION Left 02/20/2021   Procedure: VIDEO BRONCHOSCOPY WITH ENDOBRONCHIAL NAVIGATION;  Surgeon: Icard, Bradley L, DO;  Location: [facility] OR;  Service: Pulmonary;  Laterality: Left;   VIDEO BRONCHOSCOPY WITH ENDOBRONCHIAL ULTRASOUND Bilateral 02/20/2021   Procedure: VIDEO BRONCHOSCOPY WITH ENDOBRONCHIAL ULTRASOUND;  Surgeon: Icard, Bradley L, DO;  Location: [facility] OR;  Service: Pulmonary;  Laterality: Bilateral;   WISDOM TOOTH EXTRACTION     Family History  Problem Relation Age of Onset   Cancer Mother 69   Diabetes Mother    Alcohol abuse Father     Asthma Father    Diabetes Brother    Lung cancer Brother    Ovarian cancer Maternal Aunt    Liver cancer Paternal Aunt    Heart disease Neg Hx    Social History   Socioeconomic History   Marital status: Married    Spouse name: Not on file   Number of children: Not on file   Years of education: Not on file   Highest education level: Not on file  Occupational History   Not on file    Tobacco Use   Smoking status: Every Day    Packs/day: 0.50    Years: 58.00    Total pack years: 29.00    Types: Cigarettes    Start date: 08/09/1957   Smokeless tobacco: Never   Tobacco comments:    Has smoked 3 ppd, current 1.5-2 ppd  Vaping Use   Vaping Use: Never used  Substance and Sexual Activity   Alcohol use: No   Drug use: No   Sexual activity: Yes    Birth control/protection: Post-menopausal  Other Topics Concern   Not on file  Social History Narrative   High School graduate.  Some college.  Currently retired.  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 worked in [employer] most recently.      Lives with husband and 2 adult daughters and 4 grandchildren.   Social Determinants of Health   Financial Resource Strain: Not on file  Food Insecurity: Not on file  Transportation Needs: Not on file  Physical Activity: Not on file  Stress: Not on file  Social Connections: Not on file    Tobacco Counseling Ready to quit: Not Answered Counseling given: Not Answered Tobacco comments: Has smoked 3 ppd, current 1.5-2 ppd   Clinical Intake:                 Diabetic?No         Activities of Daily Living    07/07/2022   12:38 PM  In your present state of health, do you have any difficulty performing the following activities:  Hearing? 0  Vision? 0  Difficulty concentrating or making decisions? 0  Walking or climbing stairs? 0  Dressing or bathing? 0    Patient Care Team: Eniola, Kehinde T, MD as PCP - General (Family Medicine)  Indicate any recent  Medical Services you may have received from other than [facility] providers in the past year (date may be approximate).     Assessment:   This is a routine wellness examination for Erica.  Hearing/Vision screen Denies any hearing issues.  Dietary issues and exercise activities discussed:     Goals Addressed   None    Depression Screen    08/27/2022    3:34 PM 07/29/2021    9:54 AM 01/13/2021   11:05 AM 04/08/2020   11:08 AM 07/31/2019    2:47 PM 05/24/2018   11:33 AM 04/19/2018   11:00 AM  PHQ 2/9 Scores  PHQ - 2 Score 4 6 4 3 0 0 0  PHQ- 9 Score 15 14 13 16       Fall Risk    08/27/2022    3:35 PM 07/29/2021    9:54 AM 01/13/2021   11:04 AM 07/31/2019    2:47 PM 05/24/2018   11:33 AM  Fall Risk   Falls in the past year? 0 0 1 0 No  Number falls in past yr: 0 0 0 0   Injury with Fall? 0 0 1 0   Follow up Falls evaluation completed        FALL RISK PREVENTION PERTAINING TO THE HOME:  Any stairs in or around the home? {YES/NO:21197} If so, are there any without handrails? {YES/NO:21197} Home free of loose throw rugs in walkways, pet beds, electrical cords, etc? {YES/NO:21197} Adequate lighting in your home to reduce risk of falls? {YES/NO:21197}  ASSISTIVE DEVICES UTILIZED TO PREVENT FALLS:  Life alert? {YES/NO:21197} Use of a cane, walker or w/c? {YES/NO:21197} Grab bars in the bathroom? {YES/NO:21197} Shower   chair or bench in shower? {YES/NO:21197} Elevated toilet seat or a handicapped toilet? {YES/NO:21197}  TIMED UP AND GO:  Was the test performed? Unable to perform, virtual appointment    Cognitive Function:        Immunizations Immunization History  Administered Date(s) Administered   Influenza,inj,Quad PF,6+ Mos 06/01/2016, 05/24/2018, 07/31/2019, 04/08/2020   PFIZER Comirnaty(Gray Top)Covid-19 Tri-Sucrose Vaccine 01/13/2021   PFIZER(Purple Top)SARS-COV-2 Vaccination 01/17/2020, 02/07/2020   PNEUMOCOCCAL CONJUGATE-20 01/13/2021   Pneumococcal  Conjugate-13 06/01/2016   Pneumococcal Polysaccharide-23 10/27/2017   Tdap 03/08/2012    TDAP status: Due, Education has been provided regarding the importance of this vaccine. Advised may receive this vaccine at local pharmacy or Health Dept. Aware to provide a copy of the vaccination record if obtained from local pharmacy or Health Dept. Verbalized acceptance and understanding.  Flu Vaccine status: Due, Education has been provided regarding the importance of this vaccine. Advised may receive this vaccine at local pharmacy or Health Dept. Aware to provide a copy of the vaccination record if obtained from local pharmacy or Health Dept. Verbalized acceptance and understanding.  Pneumococcal vaccine status: Up to date  Covid-19 vaccine status: Declined, Education has been provided regarding the importance of this vaccine but patient still declined. Advised may receive this vaccine at local pharmacy or Health Dept.or vaccine clinic. Aware to provide a copy of the vaccination record if obtained from local pharmacy or Health Dept. Verbalized acceptance and understanding.  Qualifies for Shingles Vaccine? Yes   Zostavax completed No   Shingrix Completed?: No.    Education has been provided regarding the importance of this vaccine. Patient has been advised to call insurance company to determine out of pocket expense if they have not yet received this vaccine. Advised may also receive vaccine at local pharmacy or Health Dept. Verbalized acceptance and understanding.  Screening Tests Health Maintenance  Topic Date Due   Medicare Annual Wellness (AWV)  Never done   Zoster Vaccines- Shingrix (1 of 2) Never done   DEXA SCAN  Never done   DTaP/Tdap/Td (2 - Td or Tdap) 03/08/2022   COVID-19 Vaccine (4 - 2023-24 season) 04/09/2022   INFLUENZA VACCINE  04/10/2023 (Originally 03/09/2022)   Fecal DNA (Cologuard)  05/06/2023   Pneumonia Vaccine 65+ Years old  Completed   Hepatitis C Screening  Completed   HPV  VACCINES  Aged Out    Health Maintenance  Health Maintenance Due  Topic Date Due   Medicare Annual Wellness (AWV)  Never done   Zoster Vaccines- Shingrix (1 of 2) Never done   DEXA SCAN  Never done   DTaP/Tdap/Td (2 - Td or Tdap) 03/08/2022   COVID-19 Vaccine (4 - 2023-24 season) 04/09/2022    Colorectal cancer screening: Type of screening: Cologuard. Completed 05/05/2020. Repeat every 3 years  {Mammogram status:21018020}  DEXA Scan: Overdue  Lung Cancer Screening: (Low Dose CT Chest recommended if Age 55-80 years, 30 pack-year currently smoking OR have quit w/in 15years.) {DOES NOT does:27190::"does not"} qualify.   Lung Cancer Screening Referral: ***  Additional Screening:  Hepatitis C Screening: does qualify; Completed 06/01/2016  Vision Screening: Recommended annual ophthalmology exams for early detection of glaucoma and other disorders of the eye. Is the patient up to date with their annual eye exam?  {YES/NO:21197} Who is the provider or what is the name of the office in which the patient attends annual eye exams? *** If pt is not established with a provider, would they like to be referred to a provider to   establish care? {YES/NO:21197}.   Dental Screening: Recommended annual dental exams for proper oral hygiene  Community Resource Referral / Chronic Care Management: CRR required this visit?  No   CCM required this visit?  No      Plan:     I have personally reviewed and noted the following in the patient's chart:   Medical and social history Use of alcohol, tobacco or illicit drugs  Current medications and supplements including opioid prescriptions. Patient is not currently taking opioid prescriptions. Functional ability and status Nutritional status Physical activity Advanced directives List of other physicians Hospitalizations, surgeries, and ER visits in previous 12 months Vitals Screenings to include cognitive, depression, and falls Referrals and  appointments  In addition, I have reviewed and discussed with patient certain preventive protocols, quality metrics, and best practice recommendations. A written personalized care plan for preventive services as well as general preventive health recommendations were provided to patient.     Annison Birchard L Turki Tapanes, CMA   09/20/2022  Nurse Notes: Approximately 30 minute Non-Face -To-Face Medicare Wellness Visit        

## 2022-08-16 NOTE — Patient Instructions (Signed)

## 2022-09-17 ENCOUNTER — Ambulatory Visit: Payer: Medicare HMO | Admitting: Family Medicine

## 2022-09-29 ENCOUNTER — Ambulatory Visit: Payer: Medicare HMO

## 2022-09-29 DIAGNOSIS — Z114 Encounter for screening for human immunodeficiency virus [HIV]: Secondary | ICD-10-CM | POA: Diagnosis not present

## 2022-09-29 DIAGNOSIS — Z13220 Encounter for screening for lipoid disorders: Secondary | ICD-10-CM | POA: Diagnosis not present

## 2022-09-29 DIAGNOSIS — Z1159 Encounter for screening for other viral diseases: Secondary | ICD-10-CM | POA: Diagnosis not present

## 2022-09-29 DIAGNOSIS — Z0000 Encounter for general adult medical examination without abnormal findings: Secondary | ICD-10-CM | POA: Diagnosis not present

## 2022-09-30 LAB — COMPREHENSIVE METABOLIC PANEL
ALT: 17 IU/L (ref 0–32)
AST: 16 IU/L (ref 0–40)
Albumin/Globulin Ratio: 1.9 (ref 1.2–2.2)
Albumin: 5 g/dL — ABNORMAL HIGH (ref 3.9–4.9)
Alkaline Phosphatase: 110 IU/L (ref 44–121)
BUN/Creatinine Ratio: 15 (ref 9–23)
BUN: 10 mg/dL (ref 6–24)
Bilirubin Total: 0.5 mg/dL (ref 0.0–1.2)
CO2: 22 mmol/L (ref 20–29)
Calcium: 10.3 mg/dL — ABNORMAL HIGH (ref 8.7–10.2)
Chloride: 103 mmol/L (ref 96–106)
Creatinine, Ser: 0.66 mg/dL (ref 0.57–1.00)
Globulin, Total: 2.6 g/dL (ref 1.5–4.5)
Glucose: 88 mg/dL (ref 70–99)
Potassium: 4.1 mmol/L (ref 3.5–5.2)
Sodium: 142 mmol/L (ref 134–144)
Total Protein: 7.6 g/dL (ref 6.0–8.5)
eGFR: 112 mL/min/{1.73_m2} (ref >59)

## 2022-09-30 LAB — CBC
Hematocrit: 44.2 % (ref 34.0–46.6)
Hemoglobin: 15 g/dL (ref 11.1–15.9)
MCH: 31.7 pg (ref 26.6–33.0)
MCHC: 33.9 g/dL (ref 31.5–35.7)
MCV: 93 fL (ref 79–97)
Platelets: 147 10*3/uL — ABNORMAL LOW (ref 150–450)
RBC: 4.73 x10E6/uL (ref 3.77–5.28)
RDW: 12.5 % (ref 11.7–15.4)
WBC: 7.9 10*3/uL (ref 3.4–10.8)

## 2022-09-30 LAB — LIPID PANEL
Chol/HDL Ratio: 3.6 ratio (ref 0.0–4.4)
Cholesterol, Total: 239 mg/dL — ABNORMAL HIGH (ref 100–199)
HDL: 66 mg/dL (ref >39)
LDL Chol Calc (NIH): 156 mg/dL — ABNORMAL HIGH (ref 0–99)
Triglycerides: 97 mg/dL (ref 0–149)
VLDL Cholesterol Cal: 17 mg/dL (ref 5–40)

## 2022-09-30 LAB — HIV ANTIBODY (ROUTINE TESTING W REFLEX): HIV Screen 4th Generation wRfx: NONREACTIVE

## 2022-09-30 LAB — TSH: TSH: 1.84 u[IU]/mL (ref 0.450–4.500)

## 2022-09-30 LAB — HEPATITIS C ANTIBODY: Hep C Virus Ab: NONREACTIVE

## 2022-10-01 ENCOUNTER — Encounter: Payer: Self-pay | Admitting: Family Medicine

## 2022-10-01 ENCOUNTER — Ambulatory Visit (INDEPENDENT_AMBULATORY_CARE_PROVIDER_SITE_OTHER): Payer: Medicare HMO | Admitting: Family Medicine

## 2022-10-01 VITALS — BP 118/80 | HR 96 | Ht 60.0 in | Wt 123.0 lb

## 2022-10-01 DIAGNOSIS — F32A Depression, unspecified: Secondary | ICD-10-CM

## 2022-10-01 DIAGNOSIS — M5417 Radiculopathy, lumbosacral region: Secondary | ICD-10-CM | POA: Diagnosis not present

## 2022-10-01 DIAGNOSIS — F419 Anxiety disorder, unspecified: Secondary | ICD-10-CM

## 2022-10-01 DIAGNOSIS — M4727 Other spondylosis with radiculopathy, lumbosacral region: Secondary | ICD-10-CM

## 2022-10-01 MED ORDER — MELOXICAM 15 MG PO TABS: 15.0000 mg | ORAL_TABLET | Freq: Every day | ORAL | 0 refills | Status: DC | PRN

## 2022-10-01 MED ORDER — CYCLOBENZAPRINE HCL 5 MG PO TABS: 5.0000 mg | ORAL_TABLET | Freq: Every evening | ORAL | 1 refills | Status: DC | PRN

## 2022-10-01 NOTE — Patient Instructions (Signed)
-   Take duloxetine 30 mg daily x 2 weeks then increase to 60 mg (2 capsules) daily until return - Start home exercises / physical therapy - Return in 2 months - Contact for questions / concerns

## 2022-10-02 NOTE — Assessment & Plan Note (Signed)
Has started meloxicam and cyclobenzaprine with positive response, had missed a few days of meloxicam and noted return of symptoms. Did not get a chance to start duloxetine or attend PT but is amenable to do so. Home exercises provided and duloxetine advised. Close follow-up scheduled.

## 2022-10-02 NOTE — Assessment & Plan Note (Signed)
Will await start of duloxetine to assess response.

## 2022-10-02 NOTE — Progress Notes (Signed)
     Primary Care / Sports Medicine Office Visit  Patient Information:  Patient ID: Erica Parker, female DOB: [birth year] Age: 42 y.o. MRN: [id number]   Erica Parker is a pleasant 43 y.o. female presenting with the following:  Chief Complaint  Patient presents with   Back Pain    Vitals:   10/01/22 0823  BP: 118/80  Pulse: 96  SpO2: 98%   Vitals:   10/01/22 0823  Weight: 123 lb (55.8 kg)  Height: 5' (1.524 m)   Body mass index is 24.02 kg/m.  No results found.   Independent interpretation of notes and tests performed by another provider:   None  Procedures performed:   None  Pertinent History, Exam, Impression, and Recommendations:   Erica Parker was seen today for back pain.  Lumbosacral radiculopathy Overview: Work-related injury 04/23/2022  Assessment & Plan: Has started meloxicam and cyclobenzaprine with positive response, had missed a few days of meloxicam and noted return of symptoms. Did not get a chance to start duloxetine or attend PT but is amenable to do so. Home exercises provided and duloxetine advised. Close follow-up scheduled.   Osteoarthritis of spine with radiculopathy, lumbosacral region -     Cyclobenzaprine HCl; Take 1-2 tablets (5-10 mg total) by mouth at bedtime as needed for muscle spasms.  Dispense: 30 tablet; Refill: 1 -     Meloxicam; Take 1 tablet (15 mg total) by mouth daily as needed for pain.  Dispense: 60 tablet; Refill: 0  Anxiety and depression Overview: - zoloft (latest) - ativan - risperdal -Wellbutrin - Paxil - Prozac  Assessment & Plan: Will await start of duloxetine to assess response.      Orders & Medications Meds ordered this encounter  Medications   cyclobenzaprine (FLEXERIL) 5 MG tablet    Sig: Take 1-2 tablets (5-10 mg total) by mouth at bedtime as needed for muscle spasms.    Dispense:  30 tablet    Refill:  1   meloxicam (MOBIC) 15 MG tablet    Sig: Take 1 tablet (15 mg total) by mouth daily as  needed for pain.    Dispense:  60 tablet    Refill:  0   No orders of the defined types were placed in this encounter.    Return in about 2 months (around 11/30/2022).     Montel Culver, MD, [facility]   Primary Care Sports Medicine Primary Care and Sports Medicine at [facility]

## 2022-11-04 ENCOUNTER — Ambulatory Visit (INDEPENDENT_AMBULATORY_CARE_PROVIDER_SITE_OTHER): Payer: Medicare HMO

## 2022-11-04 VITALS — Ht 60.0 in | Wt 123.0 lb

## 2022-11-04 DIAGNOSIS — Z0000 Encounter for general adult medical examination without abnormal findings: Secondary | ICD-10-CM

## 2022-11-04 NOTE — Progress Notes (Signed)
Subjective:   Erica Parker is a 42 y.o. female who presents for an Initial Medicare Annual Wellness Visit.  I connected with  Erica Parker on 11/04/22 by a audio enabled telemedicine application and verified that I am speaking with the correct person using two identifiers.  Patient Location: Home  Provider Location: Office/Clinic  I discussed the limitations of evaluation and management by telemedicine. The patient expressed understanding and agreed to proceed.   Review of Systems    Defer to Pcp   Cardiac Risk Factors include: none     Objective:    Today's Vitals   11/04/22 1033 11/04/22 1034  Weight: 123 lb (55.8 kg)   Height: 5' (1.524 m)   PainSc: 0-No pain 0-No pain   Body mass index is 24.02 kg/m.     11/04/2022   10:35 AM 07/30/2022    8:38 AM 07/09/2017   11:44 AM  Advanced Directives  Does Patient Have a Medical Advance Directive? No No No  Would patient like information on creating a medical advance directive? No - Patient declined No - Patient declined No - Patient declined    Current Medications (verified) Outpatient Encounter Medications as of 11/04/2022  Medication Sig   CLENPIQ 10-3.5-12 MG-GM -GM/175ML SOLN Take by mouth.   cyclobenzaprine (FLEXERIL) 5 MG tablet Take 1-2 tablets (5-10 mg total) by mouth at bedtime as needed for muscle spasms.   DULoxetine (CYMBALTA) 30 MG capsule Take 1 capsule (30 mg total) by mouth daily.   meloxicam (MOBIC) 15 MG tablet Take 1 tablet (15 mg total) by mouth daily as needed for pain.   sertraline (ZOLOFT) 25 MG tablet Take 1 tablet (25 mg total) by mouth daily.   No facility-administered encounter medications on file as of 11/04/2022.    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   Premature menopause 07/2022   confirmed with  labs/sx   Past Surgical History:  Procedure Laterality Date   CESAREAN SECTION     COLONOSCOPY WITH PROPOFOL N/A 07/30/2022   Procedure: COLONOSCOPY WITH PROPOFOL;  Surgeon: Lucilla Lame, MD;  Location: [facility];  Service: Endoscopy;  Laterality: N/A;   COLPOSCOPY  07/2016   cin 1   DILATION AND CURETTAGE OF UTERUS  2012   POLYPECTOMY N/A 07/30/2022   Procedure: POLYPECTOMY;  Surgeon: Lucilla Lame, MD;  Location: [facility];  Service: Endoscopy;  Laterality: N/A;   WISDOM TOOTH EXTRACTION     age 59   Family History  Problem Relation Age of Onset   Heart failure Mother    Lung cancer Mother    Ovarian cancer Mother        Believes ovarian cancer in 44s   Heart disease Father    Colon cancer Father 38   Breast cancer Paternal Aunt 46   Stomach cancer Paternal Grandmother    Social History   Socioeconomic History   Marital status: Single    Spouse name: Not on file   Number of children: Not on file   Years of education: Not on file   Highest education level: Not on file  Occupational History   Not on file  Tobacco Use   Smoking status: Every Day    Years: 30    Types: Cigarettes  Smokeless tobacco: Never  Vaping Use   Vaping Use: Every day  Substance and Sexual Activity   Alcohol use: Yes   Drug use: No   Sexual activity: Yes    Birth control/protection: None  Other Topics Concern   Not on file  Social History Narrative   Not on file   Social Determinants of Health   Financial Resource Strain: Low Risk  (11/04/2022)   Overall Financial Resource Strain (CARDIA)    Difficulty of Paying Living Expenses: Not hard at all  Food Insecurity: No Food Insecurity (11/04/2022)   Hunger Vital Sign    Worried About Running Out of Food in the Last Year: Never true    Ran Out of Food in the Last Year: Never true  Transportation Needs: No Transportation Needs (11/04/2022)   PRAPARE - Hydrologist (Medical): No    Lack of  Transportation (Non-Medical): No  Physical Activity: Sufficiently Active (11/04/2022)   Exercise Vital Sign    Days of Exercise per Week: 5 days    Minutes of Exercise per Session: 150+ min  Stress: No Stress Concern Present (11/04/2022)   [place]    Feeling of Stress : Not at all  Social Connections: Not on file    Tobacco Counseling Ready to quit: Not Answered Counseling given: Not Answered   Clinical Intake:  Pre-visit preparation completed: Yes  Pain : No/denies pain Pain Score: 0-No pain     BMI - recorded: 24.02 Nutritional Status: BMI of 19-24  Normal Nutritional Risks: None Diabetes: No  How often do you need to have someone help you when you read instructions, pamphlets, or other written materials from your doctor or pharmacy?: 1 - Never  Diabetic? No.  Interpreter Needed?: No  Information entered by :: Wyatt Haste, [place] of Daily Living    11/04/2022   10:36 AM 07/30/2022    8:38 AM  In your present state of health, do you have any difficulty performing the following activities:  Hearing? 0 0  Vision? 0 0  Difficulty concentrating or making decisions? 0 0  Walking or climbing stairs? 0 0  Dressing or bathing? 0 0  Doing errands, shopping? 0   Preparing Food and eating ? N   Using the Toilet? N   In the past six months, have you accidently leaked urine? N   Do you have problems with loss of bowel control? N   Managing your Medications? N   Managing your Finances? N   Housekeeping or managing your Housekeeping? N     Patient Care Team: Montel Culver, MD as PCP - General (Family Medicine)  Indicate any recent Medical Services you may have received from other than [facility] providers in the past year (date may be approximate).     Assessment:   This is a routine wellness examination for Erica Parker.  Hearing/Vision screen No concerns at this time.  Dietary issues  and exercise activities discussed: Current Exercise Habits: The patient has a physically strenuous job, but has no regular exercise apart from work., Exercise limited by: None identified   Goals Addressed   None   Depression Screen    11/04/2022   10:36 AM 05/28/2022   10:10 AM 04/16/2022   10:29 AM  PHQ 2/9 Scores  PHQ - 2 Score 0 6 3  PHQ- 9 Score 0 24 15    Fall Risk    11/04/2022  10:36 AM 05/28/2022   10:09 AM 04/16/2022   10:29 AM  Fall Risk   Falls in the past year? 0 0 0  Number falls in past yr: 0 0 0  Injury with Fall? 0 0 0  Risk for fall due to : No Fall Risks No Fall Risks No Fall Risks  Follow up Falls evaluation completed Falls evaluation completed Falls evaluation completed    Bond:  Any stairs in or around the home? No  If so, are there any without handrails? No  Home free of loose throw rugs in walkways, pet beds, electrical cords, etc? Yes  Adequate lighting in your home to reduce risk of falls? Yes   ASSISTIVE DEVICES UTILIZED TO PREVENT FALLS:  Life alert? No  Use of a cane, walker or w/c? No    Cognitive Function:        11/04/2022   10:36 AM  6CIT Screen  What Year? 0 points  What month? 0 points  What time? 0 points  Count back from 20 0 points  Months in reverse 0 points  Repeat phrase 0 points  Total Score 0 points    Immunizations Immunization History  Administered Date(s) Administered   Influenza,inj,Quad PF,6+ Mos 04/16/2022    TDAP status: Due, Education has been provided regarding the importance of this vaccine. Advised may receive this vaccine at local pharmacy or Health Dept. Aware to provide a copy of the vaccination record if obtained from local pharmacy or Health Dept. Verbalized acceptance and understanding.  Flu Vaccine status: Up to date  Covid-19 vaccine status: Declined, Education has been provided regarding the importance of this vaccine but patient still declined. Advised  may receive this vaccine at local pharmacy or Health Dept.or vaccine clinic. Aware to provide a copy of the vaccination record if obtained from local pharmacy or Health Dept. Verbalized acceptance and understanding.   Screening Tests Health Maintenance  Topic Date Due   COVID-19 Vaccine (1) 08/01/2026 (Originally 09/03/1981)   Medicare Annual Wellness (AWV)  11/04/2023   PAP SMEAR-Modifier  07/09/2027   COLONOSCOPY (Pts 45-68yrs Insurance coverage will need to be confirmed)  07/31/2027   INFLUENZA VACCINE  Completed   Hepatitis C Screening  Completed   HIV Screening  Completed   HPV VACCINES  Aged Out   DTaP/Tdap/Td  Discontinued    Health Maintenance  There are no preventive care reminders to display for this patient.   Colorectal cancer screening: Type of screening: Colonoscopy. Completed 07/30/2022. Repeat every 5 years  Lung Cancer Screening: (Low Dose CT Chest recommended if Age 22-80 years, 30 pack-year currently smoking OR have quit w/in 15years.) does not qualify.   Additional Screening:  Hepatitis C Screening: does qualify; Completed 09/29/2022  Vision Screening: Recommended annual ophthalmology exams for early detection of glaucoma and other disorders of the eye. Is the patient up to date with their annual eye exam?  No  If pt is not established with a provider, would they like to be referred to a provider to establish care? No .   Dental Screening: Recommended annual dental exams for proper oral hygiene  Community Resource Referral / Chronic Care Management: CRR required this visit?  No   CCM required this visit?  No      Plan:     I have personally reviewed and noted the following in the patient's chart:   Medical and social history Use of alcohol, tobacco or illicit drugs  Current medications  and supplements including opioid prescriptions. Patient is not currently taking opioid prescriptions. Functional ability and status Nutritional status Physical  activity Advanced directives List of other physicians Hospitalizations, surgeries, and ER visits in previous 12 months Vitals Screenings to include cognitive, depression, and falls Referrals and appointments  In addition, I have reviewed and discussed with patient certain preventive protocols, quality metrics, and best practice recommendations. A written personalized care plan for preventive services as well as general preventive health recommendations were provided to patient.     Clista Bernhardt, [place]   11/04/2022   Nurse Notes: None.

## 2022-11-25 ENCOUNTER — Ambulatory Visit: Payer: Medicare HMO

## 2022-12-03 ENCOUNTER — Ambulatory Visit: Payer: Medicare HMO | Admitting: Family Medicine

## 2022-12-10 ENCOUNTER — Ambulatory Visit: Payer: Medicare HMO | Admitting: Family Medicine

## 2023-02-04 ENCOUNTER — Ambulatory Visit
Admission: EM | Admit: 2023-02-04 | Discharge: 2023-02-04 | Disposition: A | Discharge status: Home / Self Care | Payer: Medicare HMO | Attending: Family Medicine | Admitting: Family Medicine

## 2023-02-04 DIAGNOSIS — H00024 Hordeolum internum left upper eyelid: Secondary | ICD-10-CM | POA: Diagnosis not present

## 2023-02-04 MED ORDER — ERYTHROMYCIN 5 MG/GM OP OINT: TOPICAL_OINTMENT | OPHTHALMIC | 0 refills | Status: DC

## 2023-02-04 NOTE — ED Triage Notes (Signed)
Pt c/o left eye problem x3days  Pt states that it is itching and swollen. Pt states that she is having eye drainage and her eye was "crusty" this morning.

## 2023-02-04 NOTE — ED Provider Notes (Signed)
[facility]    CSN: [id number] Arrival date & time: 02/04/23  1220      History   Chief Complaint Chief Complaint  Patient presents with   Eye Problem    HPI HPI  Burgandy R Jeong is a 42 y.o. female.    Orian presents for left eye swelling and discharge that started 3 days ago.  The eye is itchy and the lid is swollen.  She did have some drainage and the eye was crusted over this morning.  Does not feel like anything is in her eye.  Does not recall anything blowing in her eye.  Lasheika has otherwise been well and has no additional concerns today.    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   Premature menopause 07/2022   confirmed with labs/sx    Patient Active Problem List   Diagnosis Date Noted   Family history of colon cancer in father 07/30/2022   Polyp of sigmoid colon 07/30/2022   Encounter for screening colonoscopy 07/30/2022   Annual physical exam 07/18/2022   Family history of breast cancer 07/08/2022   History of cervical dysplasia 07/08/2022   Lumbosacral radiculopathy 05/28/2022   Anxiety and depression 04/16/2022   Psychophysiological insomnia 04/16/2022   Need for immunization against influenza 04/16/2022   Dysplasia of cervix, low grade (CIN 1) 04/07/2022    Past Surgical History:  Procedure Laterality Date   CESAREAN SECTION     COLONOSCOPY WITH PROPOFOL N/A 07/30/2022   Procedure: COLONOSCOPY WITH PROPOFOL;  Surgeon: Midge Minium, MD;  [facility];  Service: Endoscopy;  Laterality: N/A;   COLPOSCOPY  07/2016   cin 1   DILATION AND CURETTAGE OF UTERUS  2012   POLYPECTOMY N/A 07/30/2022   Procedure: POLYPECTOMY;  Surgeon: Midge Minium, MD;  Location: [facility];  Service: Endoscopy;  Laterality: N/A;   WISDOM TOOTH EXTRACTION     age 68    OB History     Gravida  3   Para   2   Term  2   Preterm      AB  1   Living  2      SAB  1   IAB      Ectopic      Multiple      Live Births  2            Home Medications    Prior to Admission medications   Medication Sig Start Date End Date Taking? Authorizing Provider  CLENPIQ 10-3.5-12 MG-GM -GM/175ML SOLN Take by mouth. 07/10/22  Yes [provider]  cyclobenzaprine (FLEXERIL) 5 MG tablet Take 1-2 tablets (5-10 mg total) by mouth at bedtime as needed for muscle spasms. 10/01/22  Yes Jerrol Banana, MD  DULoxetine (CYMBALTA) 30 MG capsule Take 1 capsule (30 mg total) by mouth daily. 07/16/22  Yes Jerrol Banana, MD  erythromycin ophthalmic ointment Place a 1/2 inch ribbon of ointment into the lower eyelid 3 times a day for 7 days. 02/04/23  Yes Myriam Brandhorst, DO  meloxicam (MOBIC) 15 MG tablet Take 1 tablet (15 mg total) by mouth daily as needed for pain. 10/01/22  Yes Jerrol Banana, MD  sertraline (ZOLOFT) 25 MG tablet Take 1 tablet (25 mg total) by mouth daily. 04/16/22  Yes Ashley Royalty,  Ocie Bob, MD    Family History Family History  Problem Relation Age of Onset   Heart failure Mother    Lung cancer Mother    Ovarian cancer Mother        Believes ovarian cancer in 30s   Heart disease Father    Colon cancer Father 13   Breast cancer Paternal Aunt 92   Stomach cancer Paternal Grandmother     Social History Social History   Tobacco Use   Smoking status: Every Day    Years: 30    Types: Cigarettes   Smokeless tobacco: Never  Vaping Use   Vaping Use: Every day  Substance Use Topics   Alcohol use: Yes   Drug use: No     Allergies   Sulfa antibiotics   Review of Systems Review of Systems : negative unless otherwise stated in HPI.      Physical Exam Triage Vital Signs ED Triage Vitals  Enc Vitals Group     BP 02/04/23 1258 128/88     Pulse Rate 02/04/23 1258 66     Resp --      Temp 02/04/23 1258 98.9 F (37.2 C)     Temp Source 02/04/23 1258 Oral      SpO2 02/04/23 1258 94 %     Weight 02/04/23 1257 125 lb (56.7 kg)     Height 02/04/23 1257 5' (1.524 m)     Head Circumference --      Peak Flow --      Pain Score 02/04/23 1257 4     Pain Loc --      Pain Edu? --      Excl. in GC? --    No data found.  Updated Vital Signs BP 128/88 (BP Location: Left Arm)   Pulse 66   Temp 98.9 F (37.2 C) (Oral)   Ht 5' (1.524 m)   Wt 56.7 kg   LMP 11/09/2018 (Approximate)   SpO2 94%   BMI 24.41 kg/m   Visual Acuity Right Eye Distance: 20/20 Left Eye Distance: 20/20 Bilateral Distance: 20/20  Right Eye Near:   Left Eye Near:    Bilateral Near:   (Pt does not wear contacts or glasses)  Physical Exam  GEN: pleasant well appearing female, in no acute distress  NECK: normal ROM  RESP: no increased work of breathing EYES:     General: Lids are normal. Lids are everted. Vision grossly intact. Gaze aligned appropriately.        Right eye: No discharge, hordeolum, foreign body        Left eye: No foreign body.mucoid discharge in the lower lid and medial canthus upper lid do it every 3 above and internal hordeolum.     Extraocular Movements: Extraocular movements intact.     PERRLA    Conjunctiva/sclera:     Left eye: Left conjunctiva is injected. No chemosis or hemorrhage. SKIN: warm and dry   UC Treatments / Results  Labs (all labs ordered are listed, but only abnormal results are displayed) Labs Reviewed - No data to display  EKG   Radiology No results found.  Procedures Procedures (including critical care time)  Medications Ordered in UC Medications - No data to display  Initial Impression / Assessment and Plan / UC Course  I have reviewed the triage vital signs and the nursing notes.  Pertinent labs & imaging results that were available during my care of the patient were reviewed by me and considered in  my medical decision making (see chart for details).     Patient is a 42 y.o. female who presents after left  discharge for the past  3 days.  On exam, she has a evidence of left upper internal hordeolum.  Treat with erythromycin ointment.  Advised to follow-up with an ophthalmologist or optometrist, if  discomfort/pain is not improving after 5-7day course. Understanding voiced.   Discussed MDM, treatment plan and plan for follow-up with patient who agrees with plan.    Final Clinical Impressions(s) / UC Diagnoses   Final diagnoses:  Hordeolum internum of left upper eyelid     Discharge Instructions      Stop by the pharmacy to pick up your antibiotic eye medication.  Follow up with your primary eyecare provider or [facility] if symptoms suddenly worsen or you have little improvement in your eye symptoms.       ED Prescriptions     Medication Sig Dispense Auth. Provider   erythromycin ophthalmic ointment Place a 1/2 inch ribbon of ointment into the lower eyelid 3 times a day for 7 days. 3.5 g Katha Cabal, DO      PDMP not reviewed this encounter.   Katha Cabal, DO 02/07/23 [phone]

## 2023-02-04 NOTE — Discharge Instructions (Signed)
Stop by the pharmacy to pick up your antibiotic eye medication.  Follow up with your primary eyecare provider or [facility] if symptoms suddenly worsen or you have little improvement in your eye symptoms.    

## 2023-07-29 ENCOUNTER — Ambulatory Visit: Payer: Medicare HMO | Admitting: Obstetrics and Gynecology

## 2023-08-16 ENCOUNTER — Encounter: Payer: Self-pay | Admitting: Obstetrics and Gynecology

## 2023-08-16 ENCOUNTER — Ambulatory Visit: Payer: Medicare HMO | Admitting: Obstetrics and Gynecology

## 2023-08-16 VITALS — BP 95/67 | HR 81 | Ht 60.0 in | Wt 120.0 lb

## 2023-08-16 DIAGNOSIS — Z803 Family history of malignant neoplasm of breast: Secondary | ICD-10-CM

## 2023-08-16 DIAGNOSIS — E28319 Asymptomatic premature menopause: Secondary | ICD-10-CM | POA: Insufficient documentation

## 2023-08-16 DIAGNOSIS — Z01419 Encounter for gynecological examination (general) (routine) without abnormal findings: Secondary | ICD-10-CM

## 2023-08-16 DIAGNOSIS — N951 Menopausal and female climacteric states: Secondary | ICD-10-CM

## 2023-08-16 DIAGNOSIS — Z1231 Encounter for screening mammogram for malignant neoplasm of breast: Secondary | ICD-10-CM

## 2023-08-16 DIAGNOSIS — Z8041 Family history of malignant neoplasm of ovary: Secondary | ICD-10-CM | POA: Insufficient documentation

## 2023-08-16 MED ORDER — ESTRADIOL-NORETHINDRONE ACET 1-0.5 MG PO TABS: 1.0000 | ORAL_TABLET | Freq: Every day | ORAL | 0 refills | Status: DC

## 2023-08-16 NOTE — Patient Instructions (Addendum)
 I value your feedback and you entrusting [place] with your care. If you get a [facility] patient survey, I would appreciate you taking the time to let us know about your experience today. Thank you!  [facility] ([place])--[phone]

## 2023-08-16 NOTE — Progress Notes (Signed)
 PCP:  Alvia Selinda PARAS, MD   Chief Complaint  Patient presents with   Gynecologic Exam    Night sweats and hot flashes      HPI:      Erica Parker is a 43 y.o. [id number] whose LMP was Patient's last menstrual period was 11/09/2018 (approximate)., presents today for her medicare annual examination.  Her menses are absent since 2020. Was having monthly menses until 3 months prior to her 7/20 appt with Dr. Lake. Labs at that time did not suggest menopause. Pt given provera  challenge and states she had a withdrawal bleed and then subsequent monthly periods for a few months, but then periods stopped again. No bleeding since. FSH/estradiol  11/23 suggestive of menopause; no PMB.  Having significant vasomotor sx, as well a mood changes and joint aches. Pt interested in tx.   Sex activity: single partner, contraception - vasectomy. 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 There is a possible FH of ovarian cancer in her mom and pancreatic cancer in her mat uncle. . Pt doesn't meet medicare cancer genetic testing guidelines.  The patient does do self-breast exams.  Tobacco use: 1/4 ppd aware she should quit Alcohol use: social drinker Vapes delta 8.  Exercise: moderately active  She does get adequate calcium but not Vitamin D in her diet.  Colonoscopy: 12/23 at [facility] with polyps, repeat due after 5 yrs. Pt with FH of colon cancer in her dad in his early 3s.  Has occas BRBPR from known hemorrhoid with straining/hard stool.   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    Past Surgical History:  Procedure Laterality Date   CESAREAN SECTION     COLONOSCOPY WITH PROPOFOL  N/A 07/30/2022   Procedure: COLONOSCOPY WITH PROPOFOL ;  Surgeon: Jinny Carmine, MD;  Location: [facility];  Service: Endoscopy;  Laterality: N/A;   COLPOSCOPY  07/2016   cin 1   DILATION AND CURETTAGE OF UTERUS  2012   POLYPECTOMY N/A 07/30/2022   Procedure: POLYPECTOMY;  Surgeon: Jinny Carmine, MD;  Location: [facility];  Service: Endoscopy;  Laterality: N/A;   WISDOM TOOTH EXTRACTION     age 60    Family History  Problem Relation Age of Onset   Heart failure Mother    Lung cancer Mother    Ovarian cancer Mother        30s   Heart disease Father    Colon cancer Father 54   Pancreatitis Maternal Uncle    Breast cancer Paternal Aunt 13   Other Paternal Aunt        Brain tumor   Stomach cancer Paternal Grandmother     Social History   Socioeconomic  History   Marital status: Single    Spouse name: Not on file   Number of children: Not on file   Years of education: Not on file   Highest education level: Not on file  Occupational History   Not on file  Tobacco Use   Smoking status: Every Day    Types: Cigarettes   Smokeless tobacco: Never  Vaping Use   Vaping status: Some Days  Substance and Sexual Activity   Alcohol use: Yes   Drug use: No   Sexual activity: Yes    Birth control/protection: None  Other Topics Concern   Not on file  Social History Narrative   Not on file   Social Drivers of Health   Financial Resource Strain: Low Risk  (11/04/2022)   Overall Financial  Resource Strain (CARDIA)    Difficulty of Paying Living Expenses: Not hard at all  Food Insecurity: No Food Insecurity (11/04/2022)   Hunger Vital Sign    Worried About Running Out of Food in the Last Year: Never true    Ran Out of Food in the Last Year: Never true  Transportation Needs: No Transportation Needs (11/04/2022)   PRAPARE - Administrator, Civil Service (Medical): No    Lack of Transportation (Non-Medical): No  Physical Activity: Sufficiently Active (11/04/2022)   Exercise Vital Sign    Days of Exercise per Week: 5 days    Minutes of Exercise per Session: 150+ min  Stress: No Stress Concern Present (11/04/2022)   [employer] of Occupational Health - Occupational Stress Questionnaire    Feeling of Stress : Not at all  Social Connections: Not on file  Intimate Partner Violence: Not At Risk (11/04/2022)   Humiliation, Afraid, Rape, and Kick questionnaire    Fear of Current or Ex-Partner: No    Emotionally Abused: No    Physically Abused: No    Sexually Abused: No     Current Outpatient Medications:    estradiol -norethindrone  (ACTIVELLA) 1-0.5 MG tablet, Take 1 tablet by mouth daily., Disp: 84 tablet, Rfl: 0     ROS:  Review of Systems  Constitutional:  Negative for fatigue, fever and unexpected weight change.  Respiratory:  Negative for cough, shortness of breath and wheezing.   Cardiovascular:  Negative for chest pain, palpitations and leg swelling.  Gastrointestinal:  Negative for blood in stool, constipation, diarrhea, nausea and vomiting.  Endocrine: Negative for cold intolerance, heat intolerance and polyuria.  Genitourinary:  Negative for dyspareunia, dysuria, flank pain, frequency, genital sores, hematuria, menstrual problem, pelvic pain, urgency, vaginal bleeding, vaginal discharge and vaginal pain.  Musculoskeletal:  Positive for arthralgias. Negative for back pain, joint swelling and myalgias.  Skin:  Negative for rash.  Neurological:   Negative for dizziness, syncope, light-headedness, numbness and headaches.  Hematological:  Negative for adenopathy.  Psychiatric/Behavioral:  Positive for agitation and dysphoric mood. Negative for confusion, sleep disturbance and suicidal ideas. The patient is not nervous/anxious.    BREAST: No symptoms   Objective: BP 95/67   Pulse 81   Ht 5' (1.524 m)   Wt 120 lb (54.4 kg)   LMP 11/09/2018 (Approximate)   BMI 23.44 kg/m    Physical Exam Constitutional:      Appearance: She is well-developed.  Genitourinary:     Vulva normal.     Right Labia: No rash, tenderness or lesions.    Left Labia: No tenderness, lesions or rash.    No vaginal discharge, erythema or tenderness.  Right Adnexa: not tender and no mass present.    Left Adnexa: not tender and no mass present.    No cervical friability or polyp.     Uterus is not enlarged or tender.  Breasts:    Right: No mass, nipple discharge, skin change or tenderness.     Left: No mass, nipple discharge, skin change or tenderness.  Neck:     Thyroid : No thyromegaly.  Cardiovascular:     Rate and Rhythm: Normal rate and regular rhythm.     Heart sounds: Normal heart sounds. No murmur heard. Pulmonary:     Effort: Pulmonary effort is normal.     Breath sounds: Normal breath sounds.  Abdominal:     Palpations: Abdomen is soft.     Tenderness: There is no abdominal tenderness. There is no guarding or rebound.  Musculoskeletal:        General: Normal range of motion.     Cervical back: Normal range of motion.  Lymphadenopathy:     Cervical: No cervical adenopathy.  Neurological:     General: No focal deficit present.     Mental Status: She is alert and oriented to person, place, and time.     Cranial Nerves: No cranial nerve deficit.  Skin:    General: Skin is warm and dry.  Psychiatric:        Mood and Affect: Mood normal.        Behavior: Behavior normal.        Thought Content: Thought content normal.         Judgment: Judgment normal.  Vitals reviewed.     Assessment/Plan: Encounter for annual routine gynecological examination  Encounter for screening mammogram for malignant neoplasm of breast - Plan: MM 3D SCREENING MAMMOGRAM BILATERAL BREAST; pt to schedule mammo  Family history of breast cancer - Plan: MM 3D SCREENING MAMMOGRAM BILATERAL BREAST; discussed MyRisk testing and self-pay rate, handout given. Pt to consider and f/u prn.  Family history of ovarian cancer  Early menopause occurring in patient age younger than 59 years - Plan: estradiol -norethindrone  (ACTIVELLA) 1-0.5 MG tablet; discussed pros/cons of HRT given early menopause vs OTC supp. Pt will try HRT, Rx eRxd. F/u via MyChart message in 3 months re: sx.   Vasomotor symptoms due to menopause - Plan: estradiol -norethindrone  (ACTIVELLA) 1-0.5 MG tablet  Meds ordered this encounter  Medications   estradiol -norethindrone  (ACTIVELLA) 1-0.5 MG tablet    Sig: Take 1 tablet by mouth daily.    Dispense:  84 tablet    Refill:  0    Supervising Provider:   CHERRY, ANIKA [[id number]]            GYN counsel breast self exam, mammography screening, menopause, adequate intake of calcium and vitamin D, diet and exercise; d/c tob and delta 8     F/U  Return in about 1 year (around 08/15/2024).  Erica Barraclough B. Marlis Oldaker, PA-C 08/16/2023 3:17 PM

## 2023-11-21 ENCOUNTER — Other Ambulatory Visit: Payer: Self-pay | Admitting: Obstetrics and Gynecology

## 2023-11-21 DIAGNOSIS — E28319 Asymptomatic premature menopause: Secondary | ICD-10-CM

## 2023-11-21 DIAGNOSIS — N951 Menopausal and female climacteric states: Secondary | ICD-10-CM

## 2023-11-24 ENCOUNTER — Other Ambulatory Visit: Payer: Self-pay | Admitting: Obstetrics and Gynecology

## 2023-11-24 DIAGNOSIS — E28319 Asymptomatic premature menopause: Secondary | ICD-10-CM

## 2023-11-24 DIAGNOSIS — N951 Menopausal and female climacteric states: Secondary | ICD-10-CM

## 2023-11-24 MED ORDER — ESTRADIOL-NORETHINDRONE ACET 1-0.5 MG PO TABS: 1.0000 | ORAL_TABLET | Freq: Every day | ORAL | 2 refills | Status: AC

## 2023-11-24 NOTE — Progress Notes (Signed)
 Rx RF HRT. Doing well.

## 2024-03-08 ENCOUNTER — Ambulatory Visit
Admission: RE | Admit: 2024-03-08 | Discharge: 2024-03-08 | Disposition: A | Discharge status: Home / Self Care | Source: Ambulatory Visit | Attending: Obstetrics and Gynecology | Admitting: Obstetrics and Gynecology

## 2024-03-08 DIAGNOSIS — Z803 Family history of malignant neoplasm of breast: Secondary | ICD-10-CM | POA: Insufficient documentation

## 2024-03-08 DIAGNOSIS — Z1231 Encounter for screening mammogram for malignant neoplasm of breast: Secondary | ICD-10-CM | POA: Insufficient documentation

## 2024-03-13 ENCOUNTER — Ambulatory Visit: Payer: Self-pay | Admitting: Obstetrics and Gynecology
